# Patient Record
Sex: Female | Born: 2005 | Race: White | Hispanic: No | Marital: Married | Smoking: Never smoker
Health system: Southern US, Community
[De-identification: ages and names within clinical notes are randomized; demographics above are authoritative.]

## PROBLEM LIST (undated history)

## (undated) ENCOUNTER — Inpatient Hospital Stay (HOSPITAL_COMMUNITY): Payer: Self-pay

## (undated) DIAGNOSIS — R519 Headache, unspecified: Secondary | ICD-10-CM

## (undated) DIAGNOSIS — J45909 Unspecified asthma, uncomplicated: Secondary | ICD-10-CM

## (undated) DIAGNOSIS — I10 Essential (primary) hypertension: Secondary | ICD-10-CM

## (undated) HISTORY — PX: OTHER SURGICAL HISTORY: SHX169

## (undated) HISTORY — DX: Headache, unspecified: R51.9

## (undated) HISTORY — PX: ANTERIOR CRUCIATE LIGAMENT REPAIR: SHX115

## (undated) HISTORY — DX: Essential (primary) hypertension: I10

## (undated) HISTORY — PX: CHOLECYSTECTOMY: SHX55

---
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
## 2023-03-29 ENCOUNTER — Other Ambulatory Visit: Payer: Self-pay | Admitting: Obstetrics & Gynecology

## 2023-03-29 DIAGNOSIS — O3680X0 Pregnancy with inconclusive fetal viability, not applicable or unspecified: Secondary | ICD-10-CM

## 2023-03-30 ENCOUNTER — Ambulatory Visit (INDEPENDENT_AMBULATORY_CARE_PROVIDER_SITE_OTHER): Payer: Medicaid Other | Admitting: Radiology

## 2023-03-30 DIAGNOSIS — O3680X0 Pregnancy with inconclusive fetal viability, not applicable or unspecified: Secondary | ICD-10-CM | POA: Diagnosis not present

## 2023-03-30 DIAGNOSIS — Z3A01 Less than 8 weeks gestation of pregnancy: Secondary | ICD-10-CM

## 2023-03-30 NOTE — Progress Notes (Signed)
GA by LMP = 7+5 Anteverted Uterus with single viable gestation CRL = 6.01 mm = 6+3 weeks      9 days < dates      single normal yolk sac = 3.52 mm Normal ovaries - neg adnexal regions  -  neg CDS - no free fluid Cervix long and closed  EDD by today's u/s = 11-20-23

## 2023-05-02 ENCOUNTER — Encounter: Payer: Self-pay | Admitting: Women's Health

## 2023-05-03 ENCOUNTER — Encounter (HOSPITAL_COMMUNITY): Payer: Self-pay | Admitting: Obstetrics & Gynecology

## 2023-05-03 ENCOUNTER — Telehealth: Payer: Self-pay

## 2023-05-03 ENCOUNTER — Inpatient Hospital Stay (HOSPITAL_COMMUNITY)
Admission: AD | Admit: 2023-05-03 | Discharge: 2023-05-03 | Disposition: A | Discharge status: Home / Self Care | Payer: BC Managed Care – PPO | Attending: Obstetrics & Gynecology | Admitting: Obstetrics & Gynecology

## 2023-05-03 ENCOUNTER — Inpatient Hospital Stay (HOSPITAL_COMMUNITY): Payer: BC Managed Care – PPO

## 2023-05-03 ENCOUNTER — Other Ambulatory Visit: Payer: Self-pay

## 2023-05-03 DIAGNOSIS — O209 Hemorrhage in early pregnancy, unspecified: Secondary | ICD-10-CM | POA: Diagnosis present

## 2023-05-03 DIAGNOSIS — R102 Pelvic and perineal pain: Secondary | ICD-10-CM | POA: Diagnosis present

## 2023-05-03 DIAGNOSIS — Z23 Encounter for immunization: Secondary | ICD-10-CM | POA: Insufficient documentation

## 2023-05-03 DIAGNOSIS — N939 Abnormal uterine and vaginal bleeding, unspecified: Secondary | ICD-10-CM

## 2023-05-03 DIAGNOSIS — Z3A12 12 weeks gestation of pregnancy: Secondary | ICD-10-CM | POA: Insufficient documentation

## 2023-05-03 DIAGNOSIS — R109 Unspecified abdominal pain: Secondary | ICD-10-CM

## 2023-05-03 DIAGNOSIS — O26891 Other specified pregnancy related conditions, first trimester: Secondary | ICD-10-CM

## 2023-05-03 DIAGNOSIS — O26899 Other specified pregnancy related conditions, unspecified trimester: Secondary | ICD-10-CM

## 2023-05-03 DIAGNOSIS — Z6791 Unspecified blood type, Rh negative: Secondary | ICD-10-CM | POA: Diagnosis not present

## 2023-05-03 LAB — URINALYSIS, ROUTINE W REFLEX MICROSCOPIC
Bacteria, UA: NONE SEEN
Bilirubin Urine: NEGATIVE
Glucose, UA: NEGATIVE mg/dL
Ketones, ur: NEGATIVE mg/dL
Leukocytes,Ua: NEGATIVE
Nitrite: NEGATIVE
Protein, ur: NEGATIVE mg/dL
Specific Gravity, Urine: 1.01 (ref 1.005–1.030)
pH: 6 (ref 5.0–8.0)

## 2023-05-03 LAB — WET PREP, GENITAL
Clue Cells Wet Prep HPF POC: NONE SEEN
Sperm: NONE SEEN
Trich, Wet Prep: NONE SEEN
WBC, Wet Prep HPF POC: <10 (ref <10)
Yeast Wet Prep HPF POC: NONE SEEN

## 2023-05-03 LAB — CBC WITH DIFFERENTIAL/PLATELET
Abs Immature Granulocytes: 0.02 10*3/uL (ref 0.00–0.07)
Basophils Absolute: 0 10*3/uL (ref 0.0–0.1)
Basophils Relative: 1 %
Eosinophils Absolute: 0.1 10*3/uL (ref 0.0–0.5)
Eosinophils Relative: 2 %
HCT: 38.2 % (ref 36.0–46.0)
Hemoglobin: 12.7 g/dL (ref 12.0–15.0)
Immature Granulocytes: 0 %
Lymphocytes Relative: 28 %
Lymphs Abs: 1.9 10*3/uL (ref 0.7–4.0)
MCH: 29.1 pg (ref 26.0–34.0)
MCHC: 33.2 g/dL (ref 30.0–36.0)
MCV: 87.4 fL (ref 80.0–100.0)
Monocytes Absolute: 0.4 10*3/uL (ref 0.1–1.0)
Monocytes Relative: 6 %
Neutro Abs: 4.2 10*3/uL (ref 1.7–7.7)
Neutrophils Relative %: 63 %
Platelets: 218 10*3/uL (ref 150–400)
RBC: 4.37 MIL/uL (ref 3.87–5.11)
RDW: 13.7 % (ref 11.5–15.5)
WBC: 6.6 10*3/uL (ref 4.0–10.5)
nRBC: 0 % (ref 0.0–0.2)

## 2023-05-03 LAB — HCG, QUANTITATIVE, PREGNANCY: hCG, Beta Chain, Quant, S: 59661 m[IU]/mL — ABNORMAL HIGH (ref <5)

## 2023-05-03 LAB — POCT PREGNANCY, URINE: Preg Test, Ur: POSITIVE — AB

## 2023-05-03 MED ORDER — RHO D IMMUNE GLOBULIN 1500 UNIT/2ML IJ SOSY
300.0000 ug | PREFILLED_SYRINGE | Freq: Once | INTRAMUSCULAR | Status: AC
  Administered 2023-05-03: 300 ug via INTRAMUSCULAR
  Filled 2023-05-03: qty 2

## 2023-05-03 NOTE — MAU Provider Note (Addendum)
History  Kerry Johns , a  17 y.o. G2P0 at [redacted]w[redacted]d presents to MAU for concerns of VB and clots. Patient reports she experienced a large amount of bleeding which lasted for approx 1.5 hours last night around 2330 and at the same time passed a large clot, that looked like "ground up hamburger meat." She reports no further bleeding or passing of clots since. She also reports severe cramping during this time across her lower abdomen, that has since resolved and now reports the pain as 2/10. Patient recently had a baby in July of this year, at 39w and was induced for cHTN, not on meds. Patient reports receiving Rhogam postpartum, and is currently BF and bottle feeding. She denies fever, chills, N/V/D.      RN note: NAEEMA WYCHE is a 17 y.o. at Unknown here in MAU reporting: she began having heavy VB yesterday, but isn't having any today.  Reports she is having lower abdominal cramping today.  Reports has had one PNV, scheduled to return 05/09/2023. LMP: 02/04/2023 Onset of complaint: yesterday Pain score: 6  CSN: [id number]  Arrival date and time: 05/03/23 1106   None     Chief Complaint  Patient presents with   Vaginal Bleeding   Abdominal Pain   HPI  OB History     Gravida  2   Para      Term      Preterm      AB      Living         SAB      IAB      Ectopic      Multiple      Live Births              Past Medical History:  Diagnosis Date   Asthma    as a child    Past Surgical History:  Procedure Laterality Date   ANTERIOR CRUCIATE LIGAMENT REPAIR     arm surgery      Family History  Problem Relation Age of Onset   Hypertension Mother    Hypertension Father    Cancer Maternal Grandmother    Breast cancer Maternal Grandmother    Cancer Maternal Grandfather    Hypertension Paternal Grandmother    Lung cancer Paternal Grandfather     Social History   Tobacco Use   Smoking status: Never   Smokeless tobacco: Never  Substance Use  Topics   Alcohol use: No   Drug use: No    Allergies: No Known Allergies  No medications prior to admission.    Review of Systems  Constitutional:  Negative for chills and fever.  Eyes:  Negative for visual disturbance.  Cardiovascular:  Negative for chest pain and palpitations.  Gastrointestinal:  Negative for diarrhea, nausea and vomiting.  Genitourinary:  Negative for dysuria and vaginal discharge.  Neurological:  Negative for dizziness, syncope, light-headedness and headaches.   Physical Exam   Blood pressure 127/73, pulse 86, temperature 98.5 F (36.9 C), temperature source Oral, resp. rate 20, height 5' 7.5" (1.715 m), weight (!) 138.2 kg, last menstrual period 02/04/2023, SpO2 98%, unknown if currently breastfeeding.  Physical Exam Constitutional:      General: She is not in acute distress.    Appearance: She is not ill-appearing.  Pulmonary:     Effort: Pulmonary effort is normal.  Skin:    General: Skin is warm and dry.  Neurological:     Mental Status: She is oriented to  person, place, and time.  Psychiatric:        Behavior: Behavior normal.     MAU Course  Procedures Orders Placed This Encounter  Procedures   Wet prep, genital   US OB Comp Less 14 Wks   Urinalysis, Routine w reflex microscopic -Urine, Clean Catch   CBC with Differential/Platelet   hCG, quantitative, pregnancy   Pregnancy, urine POC   Rh IG workup (includes ABO/Rh)   Discharge patient    Results for orders placed or performed during the hospital encounter of 05/03/23 (from the past 24 hours)  Pregnancy, urine POC     Status: Abnormal   Collection Time: 05/03/23 11:36 AM  Result Value Ref Range   Preg Test, Ur POSITIVE (A) NEGATIVE  Urinalysis, Routine w reflex microscopic -Urine, Clean Catch     Status: Abnormal   Collection Time: 05/03/23 11:38 AM  Result Value Ref Range   Color, Urine YELLOW YELLOW   APPearance CLEAR CLEAR   Specific Gravity, Urine 1.010 1.005 - 1.030   pH  6.0 5.0 - 8.0   Glucose, UA NEGATIVE NEGATIVE mg/dL   Hgb urine dipstick MODERATE (A) NEGATIVE   Bilirubin Urine NEGATIVE NEGATIVE   Ketones, ur NEGATIVE NEGATIVE mg/dL   Protein, ur NEGATIVE NEGATIVE mg/dL   Nitrite NEGATIVE NEGATIVE   Leukocytes,Ua NEGATIVE NEGATIVE   RBC / HPF 0-5 0 - 5 RBC/hpf   WBC, UA 0-5 0 - 5 WBC/hpf   Bacteria, UA NONE SEEN NONE SEEN   Squamous Epithelial / HPF 0-5 0 - 5 /HPF   Mucus PRESENT   CBC with Differential/Platelet     Status: None   Collection Time: 05/03/23  1:15 PM  Result Value Ref Range   WBC 6.6 4.0 - 10.5 K/uL   RBC 4.37 3.87 - 5.11 MIL/uL   Hemoglobin 12.7 12.0 - 15.0 g/dL   HCT 16.1 09.6 - 04.5 %   MCV 87.4 80.0 - 100.0 fL   MCH 29.1 26.0 - 34.0 pg   MCHC 33.2 30.0 - 36.0 g/dL   RDW 40.9 81.1 - 91.4 %   Platelets 218 150 - 400 K/uL   nRBC 0.0 0.0 - 0.2 %   Neutrophils Relative % 63 %   Neutro Abs 4.2 1.7 - 7.7 K/uL   Lymphocytes Relative 28 %   Lymphs Abs 1.9 0.7 - 4.0 K/uL   Monocytes Relative 6 %   Monocytes Absolute 0.4 0.1 - 1.0 K/uL   Eosinophils Relative 2 %   Eosinophils Absolute 0.1 0.0 - 0.5 K/uL   Basophils Relative 1 %   Basophils Absolute 0.0 0.0 - 0.1 K/uL   Immature Granulocytes 0 %   Abs Immature Granulocytes 0.02 0.00 - 0.07 K/uL  Rh IG workup (includes ABO/Rh)     Status: None (Preliminary result)   Collection Time: 05/03/23  1:15 PM  Result Value Ref Range   Gestational Age(Wks) 12    ABO/RH(D) O NEG    Antibody Screen NEG    Unit Number [id number]    Blood Component Type RHIG    Unit division 00    Status of Unit ISSUED    Transfusion Status      OK TO TRANSFUSE Performed at [facility], [address], [place], [place] [address]   Wet prep, genital     Status: None   Collection Time: 05/03/23  1:29 PM   Specimen: PATH Cytology Cervicovaginal Ancillary Only  Result Value Ref Range   Yeast Wet Prep  HPF POC NONE SEEN NONE SEEN   Trich, Wet Prep NONE SEEN NONE SEEN   Clue Cells Wet Prep HPF  POC NONE SEEN NONE SEEN   WBC, Wet Prep HPF POC <10 <10   Sperm NONE SEEN   hCG, quantitative, pregnancy     Status: Abnormal   Collection Time: 05/03/23  1:50 PM  Result Value Ref Range   hCG, Beta Chain, Quant, S 59,661 (H) <5 mIU/mL    Results for orders placed or performed during the hospital encounter of 05/03/23 (from the past 24 hours)  Pregnancy, urine POC     Status: Abnormal   Collection Time: 05/03/23 11:36 AM  Result Value Ref Range   Preg Test, Ur POSITIVE (A) NEGATIVE  Urinalysis, Routine w reflex microscopic -Urine, Clean Catch     Status: Abnormal   Collection Time: 05/03/23 11:38 AM  Result Value Ref Range   Color, Urine YELLOW YELLOW   APPearance CLEAR CLEAR   Specific Gravity, Urine 1.010 1.005 - 1.030   pH 6.0 5.0 - 8.0   Glucose, UA NEGATIVE NEGATIVE mg/dL   Hgb urine dipstick MODERATE (A) NEGATIVE   Bilirubin Urine NEGATIVE NEGATIVE   Ketones, ur NEGATIVE NEGATIVE mg/dL   Protein, ur NEGATIVE NEGATIVE mg/dL   Nitrite NEGATIVE NEGATIVE   Leukocytes,Ua NEGATIVE NEGATIVE   RBC / HPF 0-5 0 - 5 RBC/hpf   WBC, UA 0-5 0 - 5 WBC/hpf   Bacteria, UA NONE SEEN NONE SEEN   Squamous Epithelial / HPF 0-5 0 - 5 /HPF   Mucus PRESENT   CBC with Differential/Platelet     Status: None   Collection Time: 05/03/23  1:15 PM  Result Value Ref Range   WBC 6.6 4.0 - 10.5 K/uL   RBC 4.37 3.87 - 5.11 MIL/uL   Hemoglobin 12.7 12.0 - 15.0 g/dL   HCT 19.1 47.8 - 29.5 %   MCV 87.4 80.0 - 100.0 fL   MCH 29.1 26.0 - 34.0 pg   MCHC 33.2 30.0 - 36.0 g/dL   RDW 62.1 30.8 - 65.7 %   Platelets 218 150 - 400 K/uL   nRBC 0.0 0.0 - 0.2 %   Neutrophils Relative % 63 %   Neutro Abs 4.2 1.7 - 7.7 K/uL   Lymphocytes Relative 28 %   Lymphs Abs 1.9 0.7 - 4.0 K/uL   Monocytes Relative 6 %   Monocytes Absolute 0.4 0.1 - 1.0 K/uL   Eosinophils Relative 2 %   Eosinophils Absolute 0.1 0.0 - 0.5 K/uL   Basophils Relative 1 %   Basophils Absolute 0.0 0.0 - 0.1 K/uL   Immature Granulocytes 0 %    Abs Immature Granulocytes 0.02 0.00 - 0.07 K/uL  Rh IG workup (includes ABO/Rh)     Status: None (Preliminary result)   Collection Time: 05/03/23  1:15 PM  Result Value Ref Range   Gestational Age(Wks) 12    ABO/RH(D) O NEG    Antibody Screen NEG    Unit Number [id number]    Blood Component Type RHIG    Unit division 00    Status of Unit ISSUED    Transfusion Status      OK TO TRANSFUSE Performed at [facility], [address], [place], [place] [address]   Wet prep, genital     Status: None   Collection Time: 05/03/23  1:29 PM   Specimen: PATH Cytology Cervicovaginal Ancillary Only  Result Value Ref Range   Yeast Wet Prep HPF POC  NONE SEEN NONE SEEN   Trich, Wet Prep NONE SEEN NONE SEEN   Clue Cells Wet Prep HPF POC NONE SEEN NONE SEEN   WBC, Wet Prep HPF POC <10 <10   Sperm NONE SEEN   hCG, quantitative, pregnancy     Status: Abnormal   Collection Time: 05/03/23  1:50 PM  Result Value Ref Range   hCG, Beta Chain, Quant, S 59,661 (H) <5 mIU/mL    MDM Missed/Spontaneous Abortion Infection, unspecified Vaginal bleeding, in pregnancy, unspecified Urinalysis: Negative. Afebrile. Low suspicion for bladder/kidney infection Wetprep: Negative GC/CT: pending ABO/Rh/RhIgG/CBCD: Vaginal bleeding in 2nd trimester. Rh negative. ABS negative.  Rhogam given hCG quant: 59,661 Ultrasound: Single living IUP measuring about 12 weeks.  - plan for discharge.   Assessment and Plan   1. Vaginal bleeding   2. Abdominal cramping   3. [redacted] weeks gestation of pregnancy   4. Rh negative state in antepartum period    - Recommended pelvic rest until followed up with OBGYN. - Reviewed worsening signs and return precautions.  - Rhogam given to patient prior to discharge for vaginal bleeding.  - Patient discharged home in stable condition and may return to MAU as needed.   Claudette Head, MSN CNM  05/03/2023, 10:13 PM

## 2023-05-03 NOTE — Telephone Encounter (Signed)
PATIENT CALLED AND STATED THAT BLEEDING STOPPED BUT WANTS A NURSE TO CALL HER

## 2023-05-03 NOTE — Telephone Encounter (Signed)
Patient states she started having bleeding last night that ran down her leg onto her carpet.  States she started having cramping as well and passed what looked like ground beef.  She continued to have to light bleeding and cramping until this morning.  Advised patient that she would need an ultrasound and blood work and possibly rhogam due to Rh negative blood.  Advised to go to MAU for evaluation. Verbalized understanding and agreeable to go.

## 2023-05-03 NOTE — MAU Note (Signed)
Kerry Johns is a 17 y.o. at Unknown here in MAU reporting: she began having heavy VB yesterday, but isn't having any today.  Reports she is having lower abdominal cramping today.  Reports has had one PNV, scheduled to return 05/09/2023. LMP: 02/04/2023 Onset of complaint: yesterday Pain score: 6 Vitals:   05/03/23 1124  BP: (!) 140/90  Pulse: (!) 104  Resp: 18  Temp: 98.4 F (36.9 C)  SpO2: 99%     FHT:NA Lab orders placed from triage:   UPT

## 2023-05-04 LAB — GC/CHLAMYDIA PROBE AMP (~~LOC~~) NOT AT ARMC
Chlamydia: NEGATIVE
Comment: NEGATIVE
Comment: NORMAL
Neisseria Gonorrhea: NEGATIVE

## 2023-05-04 LAB — RH IG WORKUP (INCLUDES ABO/RH)
ABO/RH(D): O NEG
Antibody Screen: NEGATIVE
Gestational Age(Wks): 12
Unit division: 0

## 2023-05-06 ENCOUNTER — Encounter: Payer: Self-pay | Admitting: Women's Health

## 2023-05-06 DIAGNOSIS — O0990 Supervision of high risk pregnancy, unspecified, unspecified trimester: Secondary | ICD-10-CM | POA: Insufficient documentation

## 2023-05-09 ENCOUNTER — Encounter: Payer: BC Managed Care – PPO | Admitting: *Deleted

## 2023-05-09 ENCOUNTER — Encounter: Payer: Self-pay | Admitting: Advanced Practice Midwife

## 2023-05-09 ENCOUNTER — Ambulatory Visit: Payer: BC Managed Care – PPO | Admitting: Women's Health

## 2023-05-09 VITALS — BP 141/84 | HR 89 | Wt 305.0 lb

## 2023-05-09 DIAGNOSIS — Z6841 Body Mass Index (BMI) 40.0 and over, adult: Secondary | ICD-10-CM

## 2023-05-09 DIAGNOSIS — O10911 Unspecified pre-existing hypertension complicating pregnancy, first trimester: Secondary | ICD-10-CM | POA: Diagnosis not present

## 2023-05-09 DIAGNOSIS — O10919 Unspecified pre-existing hypertension complicating pregnancy, unspecified trimester: Secondary | ICD-10-CM

## 2023-05-09 DIAGNOSIS — Z131 Encounter for screening for diabetes mellitus: Secondary | ICD-10-CM | POA: Diagnosis not present

## 2023-05-09 DIAGNOSIS — O09891 Supervision of other high risk pregnancies, first trimester: Secondary | ICD-10-CM | POA: Insufficient documentation

## 2023-05-09 DIAGNOSIS — O0991 Supervision of high risk pregnancy, unspecified, first trimester: Secondary | ICD-10-CM | POA: Diagnosis not present

## 2023-05-09 DIAGNOSIS — Z363 Encounter for antenatal screening for malformations: Secondary | ICD-10-CM

## 2023-05-09 DIAGNOSIS — Z6791 Unspecified blood type, Rh negative: Secondary | ICD-10-CM

## 2023-05-09 DIAGNOSIS — O26891 Other specified pregnancy related conditions, first trimester: Secondary | ICD-10-CM

## 2023-05-09 DIAGNOSIS — O99212 Obesity complicating pregnancy, second trimester: Secondary | ICD-10-CM

## 2023-05-09 DIAGNOSIS — O99211 Obesity complicating pregnancy, first trimester: Secondary | ICD-10-CM

## 2023-05-09 DIAGNOSIS — Z3A13 13 weeks gestation of pregnancy: Secondary | ICD-10-CM | POA: Diagnosis not present

## 2023-05-09 MED ORDER — LABETALOL HCL 200 MG PO TABS: 200.0000 mg | ORAL_TABLET | Freq: Two times a day (BID) | ORAL | 3 refills | Status: DC

## 2023-05-09 MED ORDER — ASPIRIN 81 MG PO TBEC: 162.0000 mg | DELAYED_RELEASE_TABLET | Freq: Every day | ORAL | 6 refills | Status: DC

## 2023-05-09 NOTE — Progress Notes (Signed)
INITIAL OBSTETRICAL VISIT Patient name: Kerry Johns MRN [id number]  Date of birth: Aug 10, 2005 Chief Complaint:   Initial Prenatal Visit  History of Present Illness:   Kerry Johns is a 17 y.o. G30P1001 Caucasian female at [redacted]w[redacted]d by [place] at 6 weeks with an Estimated Date of Delivery: 11/20/23 being seen today for her initial obstetrical visit.   Her obstetrical history is significant for SVD July 2024 w/o problems CHTN Morbid Obestiy .   Today she reports no complaints.     05/09/2023   11:17 AM 05/19/2022   10:35 AM  Depression screen PHQ 2/9  Decreased Interest 1 1  Down, Depressed, Hopeless 1 0  PHQ - 2 Score 2 1  Altered sleeping 1 1  Tired, decreased energy 1 1  Change in appetite 0 1  Feeling bad or failure about yourself  1 0  Trouble concentrating 1 0  Moving slowly or fidgety/restless 0 0  Suicidal thoughts 0 0  PHQ-9 Score 6 4    Patient's last menstrual period was 02/04/2023. Last pap No results found for: "DIAGPAP" Review of Systems:   Pertinent items are noted in HPI Denies cramping/contractions, leakage of fluid, vaginal bleeding, abnormal vaginal discharge w/ itching/odor/irritation, headaches, visual changes, shortness of breath, chest pain, abdominal pain, severe nausea/vomiting, or problems with urination or bowel movements unless otherwise stated above.  Pertinent History Reviewed:  Reviewed past medical,surgical, social, obstetrical and family history.  Reviewed problem list, medications and allergies. OB History  Gravida Para Term Preterm AB Living  2 1 1   1   SAB IAB Ectopic Multiple Live Births      1    # Outcome Date GA Lbr Len/2nd Weight Sex Type Anes PTL Lv  2 Current           1 Term 11/16/22 [redacted]w[redacted]d  7 lb 3 oz (3.26 kg) F Vag-Spont EPI N LIV     Complications: Chronic hypertension   Physical Assessment:   Vitals:   05/09/23 1121  BP: (!) 141/84  Pulse: 89  Weight: (!) 305 lb (138.3 kg)  Body mass index is 47.06 kg/m.        Physical Examination:  General appearance - well appearing, and in no distress  Mental status - alert, oriented to person, place, and time  Psych:  She has a normal mood and affect  Skin - warm and dry, normal color, no suspicious lesions noted  Chest - effort normal  Heart - normal rate and regular rhythm  Abdomen - soft, nontender  Extremities:  No swelling or varicosities noted    No results found for this or any previous visit (from the past 24 hours).      05/09/2023   11:17 AM 05/19/2022   10:35 AM  Depression screen PHQ 2/9  Decreased Interest 1 1  Down, Depressed, Hopeless 1 0  PHQ - 2 Score 2 1  Altered sleeping 1 1  Tired, decreased energy 1 1  Change in appetite 0 1  Feeling bad or failure about yourself  1 0  Trouble concentrating 1 0  Moving slowly or fidgety/restless 0 0  Suicidal thoughts 0 0  PHQ-9 Score 6 4        05/09/2023   11:17 AM 05/19/2022   10:35 AM  GAD 7 : Generalized Anxiety Score  Nervous, Anxious, on Edge 1 1  Control/stop worrying 1 1  Worry too much - different things 1 1  Trouble relaxing 0 0  Restless  0 1  Easily annoyed or irritable 0 1  Afraid - awful might happen 2 1  Total GAD 7 Score 5 6      Assessment & Plan:  1)High-Risk Pregnancy G2P1001 at [redacted]w[redacted]d with an Estimated Date of Delivery: 11/20/23   2) Initial OB visit    1. Supervision of high risk pregnancy in first trimester (Primary)  - Urine Culture - Protein / creatinine ratio, urine - PANORAMA PRENATAL TEST - Hemoglobin A1c - CHL AMB BABYSCRIPTS SCHEDULE OPTIMIZATION - CBC/D/Plt+RPR+Rh+ABO+RubIgG... - Comprehensive metabolic panel - GC/Chlamydia Probe Amp - HORIZON CUSTOM  2. [redacted] weeks gestation of pregnancy   3. Diabetes mellitus screening  - Hemoglobin A1c  4. Chronic hypertension during pregnancy, antepartum  - Protein / creatinine ratio, urine - Comprehensive metabolic panel- - start labetalol 200mg  BID, ASA 162mg   5. Body mass index (BMI)  45.0-49.9, adult (HCC)  - Hemoglobin A1c       Meds:  Meds ordered this encounter  Medications   labetalol (NORMODYNE) 200 MG tablet    Sig: Take 1 tablet (200 mg total) by mouth 2 (two) times daily.    Dispense:  60 tablet    Refill:  3    Supervising Provider:   Lazaro Arms [[id number]]   aspirin EC 81 MG tablet    Sig: Take 2 tablets (162 mg total) by mouth daily.    Dispense:  60 tablet    Refill:  6    Supervising Provider:   Duane Lope H [[id number]]    Initial labs obtained Continue prenatal vitamins Reviewed n/v relief measures and warning s/s to report Reviewed recommended weight gain based on pre-gravid BMI Encouraged well-balanced diet Genetic & carrier screening discussed: requests Panorama, AFP, and Horizon , declines NT/IT Ultrasound discussed; fetal survey: requested CCNC completed> form faxed if has or is planning to apply for medicaid The nature of [place] [facility] [employer] with multiple MDs and other Advanced Practice Providers was explained to patient; also emphasized that fellows, residents, and students are part of our team. Given a  home bp cuff.. Check bp weekly, let us know if >140/90.        Kerry Johns 12:15 PM

## 2023-05-09 NOTE — Patient Instructions (Signed)

## 2023-05-11 LAB — GC/CHLAMYDIA PROBE AMP
Chlamydia trachomatis, NAA: NEGATIVE
Neisseria Gonorrhoeae by PCR: NEGATIVE

## 2023-05-11 LAB — URINE CULTURE

## 2023-05-15 LAB — PANORAMA PRENATAL TEST FULL PANEL:PANORAMA TEST PLUS 5 ADDITIONAL MICRODELETIONS: FETAL FRACTION: 2.5

## 2023-05-16 ENCOUNTER — Encounter: Payer: Self-pay | Admitting: *Deleted

## 2023-05-16 ENCOUNTER — Telehealth: Payer: BC Managed Care – PPO | Admitting: *Deleted

## 2023-05-16 VITALS — BP 128/77 | HR 88

## 2023-05-16 DIAGNOSIS — Z3A13 13 weeks gestation of pregnancy: Secondary | ICD-10-CM

## 2023-05-16 DIAGNOSIS — O0991 Supervision of high risk pregnancy, unspecified, first trimester: Secondary | ICD-10-CM

## 2023-05-16 DIAGNOSIS — Z0130 Encounter for examination of blood pressure without abnormal findings: Secondary | ICD-10-CM

## 2023-05-17 LAB — CBC/D/PLT+RPR+RH+ABO+RUBIGG...
Basophils Absolute: 0 10*3/uL (ref 0.0–0.2)
Basos: 1 %
EOS (ABSOLUTE): 0.1 10*3/uL (ref 0.0–0.4)
Eos: 2 %
HCV Ab: NONREACTIVE
HIV Screen 4th Generation wRfx: NONREACTIVE
Hematocrit: 40.1 % (ref 34.0–46.6)
Hemoglobin: 13 g/dL (ref 11.1–15.9)
Hepatitis B Surface Ag: NEGATIVE
Immature Grans (Abs): 0 10*3/uL (ref 0.0–0.1)
Immature Granulocytes: 0 %
Lymphocytes Absolute: 1.9 10*3/uL (ref 0.7–3.1)
Lymphs: 30 %
MCH: 29.1 pg (ref 26.6–33.0)
MCHC: 32.4 g/dL (ref 31.5–35.7)
MCV: 90 fL (ref 79–97)
Monocytes Absolute: 0.4 10*3/uL (ref 0.1–0.9)
Monocytes: 6 %
Neutrophils Absolute: 3.9 10*3/uL (ref 1.4–7.0)
Neutrophils: 61 %
Platelets: 215 10*3/uL (ref 150–450)
RBC: 4.46 x10E6/uL (ref 3.77–5.28)
RDW: 13.9 % (ref 11.7–15.4)
RPR Ser Ql: NONREACTIVE
Rh Factor: NEGATIVE
Rubella Antibodies, IGG: 1.36 {index} (ref >0.99)
WBC: 6.3 10*3/uL (ref 3.4–10.8)

## 2023-05-17 LAB — COMPREHENSIVE METABOLIC PANEL
ALT: 42 [IU]/L — ABNORMAL HIGH (ref 0–32)
AST: 30 [IU]/L (ref 0–40)
Albumin: 4.1 g/dL (ref 4.0–5.0)
Alkaline Phosphatase: 97 [IU]/L (ref 42–106)
BUN/Creatinine Ratio: 8 — ABNORMAL LOW (ref 9–23)
BUN: 5 mg/dL — ABNORMAL LOW (ref 6–20)
Bilirubin Total: 0.5 mg/dL (ref 0.0–1.2)
CO2: 21 mmol/L (ref 20–29)
Calcium: 9.5 mg/dL (ref 8.7–10.2)
Chloride: 103 mmol/L (ref 96–106)
Creatinine, Ser: 0.59 mg/dL (ref 0.57–1.00)
Globulin, Total: 2.3 g/dL (ref 1.5–4.5)
Glucose: 73 mg/dL (ref 70–99)
Potassium: 4.2 mmol/L (ref 3.5–5.2)
Sodium: 138 mmol/L (ref 134–144)
Total Protein: 6.4 g/dL (ref 6.0–8.5)
eGFR: 133 mL/min/{1.73_m2} (ref >59)

## 2023-05-17 LAB — AB SCR+ANTIBODY ID: Antibody Screen: POSITIVE — AB

## 2023-05-17 LAB — HCV INTERPRETATION

## 2023-05-17 LAB — PROTEIN / CREATININE RATIO, URINE
Creatinine, Urine: 105.5 mg/dL
Protein, Ur: 9.4 mg/dL
Protein/Creat Ratio: 89 mg/g{creat} (ref 0–200)

## 2023-05-17 LAB — HEMOGLOBIN A1C
Est. average glucose Bld gHb Est-mCnc: 97 mg/dL
Hgb A1c MFr Bld: 5 % (ref 4.8–5.6)

## 2023-05-18 NOTE — L&D Delivery Note (Signed)
 OB/GYN Faculty Practice Delivery Note  Kerry Johns is a 18 y.o. G2P1001 s/p SVD at [redacted]w[redacted]d. She was admitted for induction of labor for cHTN.   ROM: 8h 54m with clear fluid GBS Status: Negative/-- (06/10 1133) Maximum Maternal Temperature: 82F   Labor Progress: Initial SVE: 3/50/-2. She then progressed to complete.   Delivery Date/Time: 10/30/23 1748 Delivery: At bedside due to recurrent deep variable decels, baby would recover to baseline of 130s, but decels down into the 60s. NICU called to bedside due to persistent deep variable decels with each contraction. Head delivered LOA. A loose nuchal was reduced at the perineum. The anterior shoulder was not delivering with typical traction required for delivery of anterior shoulder. No excessive traction was used to facilitate delivery of infant. A shoulder dystocia was identified -- about to call to alert OR team and call for additional nursing team, however was able to deliver infant successfully prior to. The head of the bed was laid flat. Nursing team applied suprapubic pressure. Still unable to deliver infant so posterior axillary sling attempted with successful delivery of posterior arm with subsequent delivery of anterior shoulder and remainder of body with ease. Episiotomy NOT required for delivery. Remainder of body delivered with single maternal effort. Infant dried and stimulated and was vigorous with spontaneous cry. Cord gases not collected given vigorous infant.  APGARS 8/9. Cord clamped x 2 after 1-minute delay, and cut by father of baby. Cord blood drawn. Placenta delivered spontaneously with gentle cord traction. Sticky membranes noted at posterior vagina while evaluating for lacerations, so a lower uterine sweep was performed with successful removal of membranes. Fundus firm with massage and Pitocin. Labia, perineum, vagina, and cervix inspected inspected with a hemostatic right periurethral laceration noted, which did not require  repair.  Baby Weight: pending  Placenta: Sent to L&D Complications: Shoulder dystocia <1 min Lacerations: Hemostatic right periurethral EBL: 58 mL Analgesia: Epidural   Infant:  APGAR (1 MIN): 8  APGAR (5 MINS): 9   Melanie Spires, MD OB Fellow, Faculty Practice [facility], [facility]

## 2023-05-20 LAB — HORIZON CUSTOM: REPORT SUMMARY: NEGATIVE

## 2023-06-06 ENCOUNTER — Ambulatory Visit (INDEPENDENT_AMBULATORY_CARE_PROVIDER_SITE_OTHER): Payer: BC Managed Care – PPO | Admitting: Advanced Practice Midwife

## 2023-06-06 ENCOUNTER — Encounter: Payer: Self-pay | Admitting: Advanced Practice Midwife

## 2023-06-06 VITALS — BP 133/81 | HR 113 | Wt 301.5 lb

## 2023-06-06 DIAGNOSIS — O99891 Other specified diseases and conditions complicating pregnancy: Secondary | ICD-10-CM

## 2023-06-06 DIAGNOSIS — O0992 Supervision of high risk pregnancy, unspecified, second trimester: Secondary | ICD-10-CM

## 2023-06-06 DIAGNOSIS — M549 Dorsalgia, unspecified: Secondary | ICD-10-CM

## 2023-06-06 DIAGNOSIS — Z3A16 16 weeks gestation of pregnancy: Secondary | ICD-10-CM

## 2023-06-06 MED ORDER — CYCLOBENZAPRINE HCL 10 MG PO TABS: 10.0000 mg | ORAL_TABLET | Freq: Three times a day (TID) | ORAL | 1 refills | Status: DC | PRN

## 2023-06-06 NOTE — Progress Notes (Signed)
HIGH-RISK PREGNANCY VISIT Patient name: Kerry Johns MRN [id number]  Date of birth: [birth year] Chief Complaint:   High Risk Gestation (Low back pain and abd pain)  History of Present Illness:   Kerry Johns is a 18 y.o. G75P1001 female at [redacted]w[redacted]d with an Estimated Date of Delivery: 11/20/23 being seen today for ongoing management of a high-risk pregnancy complicated by chronic hypertension currently on labetalol 200mg  BID. Did take her BP meds this am.    Today she reports LBP that radiates along the front.  Denies dysuria. Has trouble sleeping/positioning. Contractions: Not present. Vag. Bleeding: None.   . denies leaking of fluid.      05/09/2023   11:17 AM 05/19/2022   10:35 AM  Depression screen PHQ 2/9  Decreased Interest 1 1  Down, Depressed, Hopeless 1 0  PHQ - 2 Score 2 1  Altered sleeping 1 1  Tired, decreased energy 1 1  Change in appetite 0 1  Feeling bad or failure about yourself  1 0  Trouble concentrating 1 0  Moving slowly or fidgety/restless 0 0  Suicidal thoughts 0 0  PHQ-9 Score 6 4        05/09/2023   11:17 AM 05/19/2022   10:35 AM  GAD 7 : Generalized Anxiety Score  Nervous, Anxious, on Edge 1 1  Control/stop worrying 1 1  Worry too much - different things 1 1  Trouble relaxing 0 0  Restless 0 1  Easily annoyed or irritable 0 1  Afraid - awful might happen 2 1  Total GAD 7 Score 5 6     Review of Systems:   Pertinent items are noted in HPI Denies abnormal vaginal discharge w/ itching/odor/irritation, headaches, visual changes, shortness of breath, chest pain, abdominal pain, severe nausea/vomiting, or problems with urination or bowel movements unless otherwise stated above. Pertinent History Reviewed:  Reviewed past medical,surgical, social, obstetrical and family history.  Reviewed problem list, medications and allergies. Physical Assessment:   Vitals:   06/06/23 1021 06/06/23 1041 06/06/23 1043  BP: (!) 143/89 133/81 133/81  Pulse: (!)  113 (!) 113   Weight: (!) 301 lb 8 oz (136.8 kg)    Body mass index is 46.52 kg/m.           Physical Examination:   General appearance: alert, well appearing, and in no distress  Mental status: alert, oriented to person, place, and time  Skin: warm & dry   Extremities: Edema: None    Cardiovascular: normal heart rate noted  Respiratory: normal respiratory effort, no distress  Abdomen: gravid, soft, non-tender  Pelvic: Cervical exam deferred         Chaperone: N/A    Fetal Status: Fetal Heart Rate (bpm): 162        Fetal Surveillance Testing today: doppler.  Declined further attempts at cfDNA (tried X2)     No results found for this or any previous visit (from the past 24 hours).  Assessment & Plan:  High-risk pregnancy: G2P1001 at [redacted]w[redacted]d with an Estimated Date of Delivery: 11/20/23   1. Supervision of high risk pregnancy in second trimester (Primary)-  2.  LBP:  try flexeril, check urine cx    Meds:  Meds ordered this encounter  Medications   cyclobenzaprine (FLEXERIL) 10 MG tablet    Sig: Take 1 tablet (10 mg total) by mouth every 8 (eight) hours as needed for muscle spasms.    Dispense:  30 tablet    Refill:  1  Supervising Provider:   Lazaro Arms [[id number]]    Orders:  Orders Placed This Encounter  Procedures   Urine Culture     Labs/procedures today: urine culture   Reviewed: general obstetric precautions including but not limited to vaginal bleeding, contractions, leaking of fluid and fetal movement were reviewed in detail with the patient.  All questions were answered. Does have home bp cuff. Office bp cuff given: not applicable. Check bp weekly, let us know if consistently >140 and/or >90.  Follow-up: No follow-ups on file.   Future Appointments  Date Time Provider Department Center  06/27/2023 10:00 AM [facility] - [facility] 2 [facility] None  06/27/2023 10:50 AM Cheral Marker, CNM [facility]    Orders Placed This Encounter  Procedures   Urine  Culture   Jacklyn Shell , DNP, CNM [facility] 06/06/2023 10:44 AM

## 2023-06-06 NOTE — Patient Instructions (Signed)
For your lower back pain you may: Purchase a pregnancy/maternity support belt from [employer], [employer], [employer], [employer], etc and wear it while you are up and about Take warm baths Use a heating pad to your lower back for no longer than 20 minutes at a time, and do not place near abdomen Take tylenol as needed. Please follow directions on the bottle Kinesiology tape (can get from sporting goods store), google how to tape belly for pregnancy

## 2023-06-08 LAB — URINE CULTURE

## 2023-06-27 ENCOUNTER — Encounter: Payer: Self-pay | Admitting: Women's Health

## 2023-06-27 ENCOUNTER — Ambulatory Visit: Payer: BC Managed Care – PPO | Admitting: Radiology

## 2023-06-27 ENCOUNTER — Ambulatory Visit (INDEPENDENT_AMBULATORY_CARE_PROVIDER_SITE_OTHER): Payer: Medicaid Other | Admitting: Women's Health

## 2023-06-27 VITALS — BP 123/80 | HR 97 | Wt 297.0 lb

## 2023-06-27 DIAGNOSIS — O0992 Supervision of high risk pregnancy, unspecified, second trimester: Secondary | ICD-10-CM

## 2023-06-27 DIAGNOSIS — O10912 Unspecified pre-existing hypertension complicating pregnancy, second trimester: Secondary | ICD-10-CM

## 2023-06-27 DIAGNOSIS — Z3A19 19 weeks gestation of pregnancy: Secondary | ICD-10-CM

## 2023-06-27 DIAGNOSIS — Z363 Encounter for antenatal screening for malformations: Secondary | ICD-10-CM | POA: Diagnosis not present

## 2023-06-27 DIAGNOSIS — Z362 Encounter for other antenatal screening follow-up: Secondary | ICD-10-CM

## 2023-06-27 DIAGNOSIS — Z6841 Body Mass Index (BMI) 40.0 and over, adult: Secondary | ICD-10-CM

## 2023-06-27 DIAGNOSIS — O10919 Unspecified pre-existing hypertension complicating pregnancy, unspecified trimester: Secondary | ICD-10-CM

## 2023-06-27 NOTE — Progress Notes (Signed)
 HIGH-RISK PREGNANCY VISIT Patient name: Kerry Johns MRN [id number]  Date of birth: 2005-11-01 Chief Complaint:   Routine Prenatal Visit and Pregnancy Ultrasound  History of Present Illness:   Kerry Johns is a 18 y.o. G66P1001 female at [redacted]w[redacted]d with an Estimated Date of Delivery: 11/20/23 being seen today for ongoing management of a high-risk pregnancy complicated by chronic hypertension currently on no meds (see note below) and PG BMI 45.    Today she reports  stopped labetalol  b/c was causing sores on head and felt terrible, checks bp bid and all <140/90 . Contractions: Not present.  .  Movement: Present. denies leaking of fluid.      05/09/2023   11:17 AM 05/19/2022   10:35 AM  Depression screen PHQ 2/9  Decreased Interest 1 1  Down, Depressed, Hopeless 1 0  PHQ - 2 Score 2 1  Altered sleeping 1 1  Tired, decreased energy 1 1  Change in appetite 0 1  Feeling bad or failure about yourself  1 0  Trouble concentrating 1 0  Moving slowly or fidgety/restless 0 0  Suicidal thoughts 0 0  PHQ-9 Score 6 4        05/09/2023   11:17 AM 05/19/2022   10:35 AM  GAD 7 : Generalized Anxiety Score  Nervous, Anxious, on Edge 1 1  Control/stop worrying 1 1  Worry too much - different things 1 1  Trouble relaxing 0 0  Restless 0 1  Easily annoyed or irritable 0 1  Afraid - awful might happen 2 1  Total GAD 7 Score 5 6     Review of Systems:   Pertinent items are noted in HPI Denies abnormal vaginal discharge w/ itching/odor/irritation, headaches, visual changes, shortness of breath, chest pain, abdominal pain, severe nausea/vomiting, or problems with urination or bowel movements unless otherwise stated above. Pertinent History Reviewed:  Reviewed past medical,surgical, social, obstetrical and family history.  Reviewed problem list, medications and allergies. Physical Assessment:   Vitals:   06/27/23 1034  BP: 123/80  Pulse: 97  Weight: 297 lb (134.7 kg)  Body mass index is  45.83 kg/m.           Physical Examination:   General appearance: alert, well appearing, and in no distress  Mental status: alert, oriented to person, place, and time  Skin: warm & dry   Extremities: Edema: None    Cardiovascular: normal heart rate noted  Respiratory: normal respiratory effort, no distress  Abdomen: gravid, soft, non-tender  Pelvic: Cervical exam deferred         Fetal Status:     Movement: Present    Fetal Surveillance Testing today:  US :  GA = 19+1 weeks  Single active female fetus, breech, FHR = 150 bpm, posterior pl, Gr1 MVP = 5.1 cm,  EFW 61%,  292g     Incomplete anatomy screen, need all cardiac views and Profile view/NB's CL = 3.85 cm, closed -  Nl ov's   Chaperone: N/A    No results found for this or any previous visit (from the past 24 hours).  Assessment & Plan:  High-risk pregnancy: G2P1001 at [redacted]w[redacted]d with an Estimated Date of Delivery: 11/20/23   1) CHTN, stopped labetalol  d/t sores on head and feeling terrible, bp stable, home bp's wnl. Continue checking at least daily at home, if >140/90 let us  know. Not taking ASA consistently, discussed reason/importance  2) PG BMI 45  Meds: No orders of the defined types were placed  in this encounter.   Labs/procedures today: U/S and declined flu shot, had originally wanted AFP-declined today  Treatment Plan:  U/S q4wks   2x/wk nst or weekly BPP @ 34wks (32wk if goes back on bp meds)   Deliver @ 39-40wks (or 37-39 if goes back on bp meds)  Reviewed: Preterm labor symptoms and general obstetric precautions including but not limited to vaginal bleeding, contractions, leaking of fluid and fetal movement were reviewed in detail with the patient.  All questions were answered. Does have home bp cuff. Office bp cuff given: not applicable. Check bp daily, let us  know if consistently >140 and/or >90.  Follow-up: Return in about 4 weeks (around 07/25/2023) for HROB, US :EFW, MD or CNM, in person.   Future Appointments   Date Time Provider Department Center  07/20/2023 10:45 AM [facility] - [facility] US  [facility] None  07/20/2023 11:30 AM Jolayne Natter, CNM [facility]    Orders Placed This Encounter  Procedures   US  OB Follow Up   Ferd Householder CNM, [facility] 06/27/2023 11:22 AM

## 2023-06-27 NOTE — Progress Notes (Signed)
 US :  GA = 19+1 weeks  Single active female fetus, breech, FHR = 150 bpm, posterior pl, Gr1 MVP = 5.1 cm,  EFW 61%,  292g     Incomplete anatomy screen, need all cardiac views and Profile view/NB's CL = 3.85 cm, closed -  Nl ov's

## 2023-06-27 NOTE — Patient Instructions (Signed)
Kerry Johns, thank you for choosing our office today! We appreciate the opportunity to meet your healthcare needs. You may receive a short survey by mail, e-mail, or through [employer]. If you are happy with your care we would appreciate if you could take just a few minutes to complete the survey questions. We read all of your comments and take your feedback very seriously. Thank you again for choosing our office.  Center for [employer] Team at [place] at [facility] ([place], [place] [address]) Entrance C, located off of [address] parking  Go to [email or web address] to register for FREE online childbirth classes  Call the office [phone]) or go to [facility] if: You begin to severe cramping Your water breaks.  Sometimes it is a big gush of fluid, sometimes it is just a trickle that keeps getting your panties wet or running down your legs You have vaginal bleeding.  It is normal to have a small amount of spotting if your cervix was checked.   [facility] Pediatricians/Family Doctors [facility]): [address] [place], [facility]: [address] [place], [phone]                [facility]): [place], [phone] (call to ask if accepting patients) [facility] Main Department: [place] [address], [place], [place] Pediatricians/Family Doctors [facility]): [place]. [place], [address], [facility]: [address] [place], [place] [facility]: [place], [place] [facility]): [phone] [facility]: [address], [place]: [address] [place]: [phone], [phone]  Home Blood Pressure Monitoring for Patients   Your provider has recommended that you check your blood pressure (BP) at least once a week at home. If you do not have a blood pressure cuff at home, one will be provided for you. Contact your provider if you have not received your monitor within 1 week.   Helpful Tips for Accurate Home Blood Pressure Checks  Don't smoke, exercise, or drink caffeine 30 minutes before checking your BP Use the restroom before checking your BP (a full bladder can raise your pressure) Relax in a comfortable upright chair Feet on the ground Left arm resting comfortably on a flat surface at the level of your heart Legs uncrossed Back supported Sit quietly and don't talk Place the cuff on your bare arm Adjust snuggly, so that only two fingertips can fit between your skin and the top of the cuff Check 2 readings separated by at least one minute Keep a log of your BP readings For a visual, please reference this diagram: http://ccnc.care/bpdiagram  Provider Name: [facility]     Phone: [phone]  Zone 1: ALL CLEAR  Continue to monitor your symptoms:  BP reading is less than 140 (top number) or less than 90 (bottom number)  No right upper stomach pain No headaches or seeing spots No feeling nauseated or throwing up No swelling in face and hands  Zone 2: CAUTION Call your doctor's office for any of the following:  BP reading is greater than 140 (top number) or greater than  90 (bottom number)  Stomach pain under your ribs in the middle or right side Headaches or seeing spots Feeling nauseated or throwing up Swelling in face and hands  Zone 3: EMERGENCY  Seek immediate medical care if you have any of the following:  BP reading is greater than160 (top number) or greater than 110 (bottom number) Severe headaches not improving with Tylenol Serious difficulty catching your breath Any worsening symptoms from  Zone 2     Second Trimester of Pregnancy The second trimester is from week 14 through week 27 (months 4 through 6). The second trimester is often a time when you feel your best. Your body has adjusted to being pregnant, and you begin to feel better physically. Usually, morning sickness has lessened or quit completely, you may have more energy, and you may have an increase in appetite. The second trimester is also a time when the fetus is growing rapidly. At the end of the sixth month, the fetus is about 9 inches long and weighs about 1 pounds. You will likely begin to feel the baby move (quickening) between 16 and 20 weeks of pregnancy. Body changes during your second trimester Your body continues to go through many changes during your second trimester. The changes vary from woman to woman. Your weight will continue to increase. You will notice your lower abdomen bulging out. You may begin to get stretch marks on your hips, abdomen, and breasts. You may develop headaches that can be relieved by medicines. The medicines should be approved by your health care provider. You may urinate more often because the fetus is pressing on your bladder. You may develop or continue to have heartburn as a result of your pregnancy. You may develop constipation because certain hormones are causing the muscles that push waste through your intestines to slow down. You may develop hemorrhoids or swollen, bulging veins (varicose veins). You may have back pain. This is caused by: Weight gain. Pregnancy hormones that are relaxing the joints in your pelvis. A shift in weight and the muscles that support your balance. Your breasts will continue to grow and they will continue to become tender. Your gums may bleed and may be sensitive to brushing and flossing. Dark spots or blotches (chloasma, mask of pregnancy) may develop on your face. This will likely fade after the baby is born. A dark line from your belly button to  the pubic area (linea nigra) may appear. This will likely fade after the baby is born. You may have changes in your hair. These can include thickening of your hair, rapid growth, and changes in texture. Some women also have hair loss during or after pregnancy, or hair that feels dry or thin. Your hair will most likely return to normal after your baby is born.  What to expect at prenatal visits During a routine prenatal visit: You will be weighed to make sure you and the fetus are growing normally. Your blood pressure will be taken. Your abdomen will be measured to track your baby's growth. The fetal heartbeat will be listened to. Any test results from the previous visit will be discussed.  Your health care provider may ask you: How you are feeling. If you are feeling the baby move. If you have had any abnormal symptoms, such as leaking fluid, bleeding, severe headaches, or abdominal cramping. If you are using any tobacco products, including cigarettes, chewing tobacco, and electronic cigarettes. If you have any questions.  Other tests that may be performed during  your second trimester include: Blood tests that check for: Low iron levels (anemia). High blood sugar that affects pregnant women (gestational diabetes) between 41 and 28 weeks. Rh antibodies. This is to check for a protein on red blood cells (Rh factor). Urine tests to check for infections, diabetes, or protein in the urine. An ultrasound to confirm the proper growth and development of the baby. An amniocentesis to check for possible genetic problems. Fetal screens for spina bifida and Down syndrome. HIV (human immunodeficiency virus) testing. Routine prenatal testing includes screening for HIV, unless you choose not to have this test.  Follow these instructions at home: Medicines Follow your health care provider's instructions regarding medicine use. Specific medicines may be either safe or unsafe to take during  pregnancy. Take a prenatal vitamin that contains at least 600 micrograms (mcg) of folic acid. If you develop constipation, try taking a stool softener if your health care provider approves. Eating and drinking Eat a balanced diet that includes fresh fruits and vegetables, whole grains, good sources of protein such as meat, eggs, or tofu, and low-fat dairy. Your health care provider will help you determine the amount of weight gain that is right for you. Avoid raw meat and uncooked cheese. These carry germs that can cause birth defects in the baby. If you have low calcium intake from food, talk to your health care provider about whether you should take a daily calcium supplement. Limit foods that are high in fat and processed sugars, such as fried and sweet foods. To prevent constipation: Drink enough fluid to keep your urine clear or pale yellow. Eat foods that are high in fiber, such as fresh fruits and vegetables, whole grains, and beans. Activity Exercise only as directed by your health care provider. Most women can continue their usual exercise routine during pregnancy. Try to exercise for 30 minutes at least 5 days a week. Stop exercising if you experience uterine contractions. Avoid heavy lifting, wear low heel shoes, and practice good posture. A sexual relationship may be continued unless your health care provider directs you otherwise. Relieving pain and discomfort Wear a good support bra to prevent discomfort from breast tenderness. Take warm sitz baths to soothe any pain or discomfort caused by hemorrhoids. Use hemorrhoid cream if your health care provider approves. Rest with your legs elevated if you have leg cramps or low back pain. If you develop varicose veins, wear support hose. Elevate your feet for 15 minutes, 3-4 times a day. Limit salt in your diet. Prenatal Care Write down your questions. Take them to your prenatal visits. Keep all your prenatal visits as told by your health  care provider. This is important. Safety Wear your seat belt at all times when driving. Make a list of emergency phone numbers, including numbers for family, friends, the hospital, and police and fire departments. General instructions Ask your health care provider for a referral to a local prenatal education class. Begin classes no later than the beginning of month 6 of your pregnancy. Ask for help if you have counseling or nutritional needs during pregnancy. Your health care provider can offer advice or refer you to specialists for help with various needs. Do not use hot tubs, steam rooms, or saunas. Do not douche or use tampons or scented sanitary pads. Do not cross your legs for long periods of time. Avoid cat litter boxes and soil used by cats. These carry germs that can cause birth defects in the baby and possibly loss of the  fetus by miscarriage or stillbirth. Avoid all smoking, herbs, alcohol, and unprescribed drugs. Chemicals in these products can affect the formation and growth of the baby. Do not use any products that contain nicotine or tobacco, such as cigarettes and e-cigarettes. If you need help quitting, ask your health care provider. Visit your dentist if you have not gone yet during your pregnancy. Use a soft toothbrush to brush your teeth and be gentle when you floss. Contact a health care provider if: You have dizziness. You have mild pelvic cramps, pelvic pressure, or nagging pain in the abdominal area. You have persistent nausea, vomiting, or diarrhea. You have a bad smelling vaginal discharge. You have pain when you urinate. Get help right away if: You have a fever. You are leaking fluid from your vagina. You have spotting or bleeding from your vagina. You have severe abdominal cramping or pain. You have rapid weight gain or weight loss. You have shortness of breath with chest pain. You notice sudden or extreme swelling of your face, hands, ankles, feet, or legs. You  have not felt your baby move in over an hour. You have severe headaches that do not go away when you take medicine. You have vision changes. Summary The second trimester is from week 14 through week 27 (months 4 through 6). It is also a time when the fetus is growing rapidly. Your body goes through many changes during pregnancy. The changes vary from woman to woman. Avoid all smoking, herbs, alcohol, and unprescribed drugs. These chemicals affect the formation and growth your baby. Do not use any tobacco products, such as cigarettes, chewing tobacco, and e-cigarettes. If you need help quitting, ask your health care provider. Contact your health care provider if you have any questions. Keep all prenatal visits as told by your health care provider. This is important. This information is not intended to replace advice given to you by your health care provider. Make sure you discuss any questions you have with your health care provider. Document Released: 04/27/2001 Document Revised: 10/09/2015 Document Reviewed: 07/04/2012 Elsevier Interactive Patient Education  2017 [employer].

## 2023-07-20 ENCOUNTER — Encounter: Payer: Self-pay | Admitting: Advanced Practice Midwife

## 2023-07-20 ENCOUNTER — Ambulatory Visit (INDEPENDENT_AMBULATORY_CARE_PROVIDER_SITE_OTHER): Payer: BC Managed Care – PPO | Admitting: Advanced Practice Midwife

## 2023-07-20 ENCOUNTER — Ambulatory Visit: Payer: BC Managed Care – PPO | Admitting: Radiology

## 2023-07-20 VITALS — BP 122/78 | HR 103 | Wt 301.0 lb

## 2023-07-20 DIAGNOSIS — Z6841 Body Mass Index (BMI) 40.0 and over, adult: Secondary | ICD-10-CM | POA: Diagnosis not present

## 2023-07-20 DIAGNOSIS — O10912 Unspecified pre-existing hypertension complicating pregnancy, second trimester: Secondary | ICD-10-CM | POA: Diagnosis not present

## 2023-07-20 DIAGNOSIS — Z3A22 22 weeks gestation of pregnancy: Secondary | ICD-10-CM

## 2023-07-20 DIAGNOSIS — O0992 Supervision of high risk pregnancy, unspecified, second trimester: Secondary | ICD-10-CM

## 2023-07-20 DIAGNOSIS — Z362 Encounter for other antenatal screening follow-up: Secondary | ICD-10-CM | POA: Diagnosis not present

## 2023-07-20 DIAGNOSIS — O10919 Unspecified pre-existing hypertension complicating pregnancy, unspecified trimester: Secondary | ICD-10-CM

## 2023-07-20 NOTE — Progress Notes (Signed)
 HIGH-RISK PREGNANCY VISIT Patient name: Kerry Johns MRN [id number]  Date of birth: [birth year] Chief Complaint:   Routine Prenatal Visit and Pregnancy Ultrasound  History of Present Illness:   Kerry Johns is a 18 y.o. G43P1001 female at [redacted]w[redacted]d with an Estimated Date of Delivery: 11/20/23 being seen today for ongoing management of a high-risk pregnancy complicated by chronic hypertension currently on no meds; PG BMI 45.    Today she reports no complaints. Contractions: Not present.  .  Movement: Present. denies leaking of fluid.      05/09/2023   11:17 AM 05/19/2022   10:35 AM  Depression screen PHQ 2/9  Decreased Interest 1 1  Down, Depressed, Hopeless 1 0  PHQ - 2 Score 2 1  Altered sleeping 1 1  Tired, decreased energy 1 1  Change in appetite 0 1  Feeling bad or failure about yourself  1 0  Trouble concentrating 1 0  Moving slowly or fidgety/restless 0 0  Suicidal thoughts 0 0  PHQ-9 Score 6 4        05/09/2023   11:17 AM 05/19/2022   10:35 AM  GAD 7 : Generalized Anxiety Score  Nervous, Anxious, on Edge 1 1  Control/stop worrying 1 1  Worry too much - different things 1 1  Trouble relaxing 0 0  Restless 0 1  Easily annoyed or irritable 0 1  Afraid - awful might happen 2 1  Total GAD 7 Score 5 6     Review of Systems:   Pertinent items are noted in HPI Denies abnormal vaginal discharge w/ itching/odor/irritation, headaches, visual changes, shortness of breath, chest pain, abdominal pain, severe nausea/vomiting, or problems with urination or bowel movements unless otherwise stated above. Pertinent History Reviewed:  Reviewed past medical,surgical, social, obstetrical and family history.  Reviewed problem list, medications and allergies. Physical Assessment:   Vitals:   07/20/23 1125  BP: 122/78  Pulse: (!) 103  Weight: (!) 301 lb (136.5 kg)  Body mass index is 46.45 kg/m.           Physical Examination:   General appearance: alert, well appearing,  and in no distress  Mental status: alert, oriented to person, place, and time  Skin: warm & dry   Extremities: Edema: None    Cardiovascular: normal heart rate noted  Respiratory: normal respiratory effort, no distress  Abdomen: gravid, soft, non-tender  Pelvic: Cervical exam deferred         Fetal Status: Fetal Heart Rate (bpm): 143 u/s   Movement: Present    Fetal Surveillance Testing today: [place]:  GA = 22+3 weeks  Single active female fetus, breech prone position, FHR = 143 bpm Posterior pl gr1,  EFW 80%  AC 72%   MVP = 5.1 cm Cardiac screen incomplete - need all cardiac structures documented - decreased resolution due to high BMI and fetal prone position.  Normal ov's -  CL = 4.2 cm,  closed   No results found for this or any previous visit (from the past 24 hours).  Assessment & Plan:  High-risk pregnancy: G2P1001 at [redacted]w[redacted]d with an Estimated Date of Delivery: 11/20/23   1) cHTN, stable without meds; had undesirable side effects from Labetalol> if needs meds, discussed using Procardia; growth u/s standing order in q 4wks  2) PG BMI 45, will need testing @ 34wks  3) Cardiac anatomy not fully seen, will try again at growth next month  Meds: No orders of the defined types were  placed in this encounter.   Labs/procedures today: U/S  Treatment Plan:  growth q 4wks; 34wk start testing  Reviewed: Preterm labor symptoms and general obstetric precautions including but not limited to vaginal bleeding, contractions, leaking of fluid and fetal movement were reviewed in detail with the patient.  All questions were answered. Does have home bp cuff. Office bp cuff given: not applicable. Check bp daily, let us know if consistently >140 and/or >90.  Follow-up: Return in about 4 weeks (around 08/17/2023) for HROB, PN2, [place]: EFW (and f/u cardiac anatomy).   Future Appointments  Date Time Provider Department Center  08/22/2023  8:30 AM [facility]  08/22/2023 10:00 AM [facility] IM[facility] 2  [facility] None  08/22/2023 10:50 AM Cheral Marker, CNM [facility]    Orders Placed This Encounter  Procedures   US OB Follow Up   Arabella Merles [facility] 07/20/2023 11:49 AM

## 2023-07-20 NOTE — Patient Instructions (Signed)
 Kerry Johns, I greatly value your feedback.  If you receive a survey following your visit with [place] today, we appreciate you taking the time to fill it out.  Thanks, Philipp Deputy, CNM   You will have your sugar test next visit.  Please do not eat or drink anything after midnight the night before you come, not even water.  You will be here for at least two hours.  Please make an appointment online for the bloodwork at [email or web address] for 8:30am (or as close to this as possible). Make sure you select the [facility] service center. The day of the appointment, check in with our office first, then you will go to [facility] to start the sugar test.    [facility] HAS MOVED!!! It is now [facility] at [facility] ([address] [place], [place] [address]) Entrance C, located off of [address] valet parking  Go to [email or web address] to register for FREE online childbirth classes   Call the office [phone]) or go to [facility] if: You begin to have strong, frequent contractions Your water breaks.  Sometimes it is a big gush of fluid, sometimes it is just a trickle that keeps getting your panties wet or running down your legs You have vaginal bleeding.  It is normal to have a small amount of spotting if your cervix was checked.  You don't feel your baby moving like normal.  If you don't, get you something to eat and drink and lay down and focus on feeling your baby move.   If your baby is still not moving like normal, you should call the office or go to [facility].  [place] Pediatricians/Family Doctors: [facility] [phone]           [facility] [phone]                [facility] [phone] (usually not accepting new patients unless you have family there already, you are always welcome to call and ask)      [facility] Department [phone]       [facility] Pediatricians/Family Doctors:  [facility]: [phone] [facility]: [phone] [facility]: [phone]  [facility] Doctors:  [facility]: [phone]  [facility]: [phone]  [facility] Doctors: [facility]: [phone]   Home Blood Pressure Monitoring for Patients   Your provider has recommended that you check your blood pressure (BP) at least once a week at home. If you do not have a blood pressure cuff at home, one will be provided for you. Contact your provider if you have not received your monitor within 1 week.   Helpful Tips for Accurate Home Blood Pressure Checks  Don't smoke, exercise, or drink caffeine 30 minutes before checking your BP Use the restroom before checking your BP (a full bladder can raise your pressure) Relax in a comfortable upright chair Feet on the ground Left arm resting comfortably on a flat surface at the level of your heart Legs uncrossed Back supported Sit quietly and don't talk Place the cuff on your bare arm Adjust snuggly, so that only two fingertips can fit between your skin and the top of the cuff Check 2 readings separated by at least one minute Keep a log of your BP readings For a visual, please reference this diagram: http://ccnc.care/bpdiagram  Provider Name: [facility]     Phone: [phone]  Zone 1: ALL CLEAR  Continue to monitor your symptoms:  BP reading is less than 140 (top number) or less than 90 (bottom number)  No right upper stomach pain No headaches or seeing spots No feeling nauseated or throwing up No swelling in face and hands  Zone 2: CAUTION Call your doctor's office for any of the following:  BP reading is greater than 140 (top number) or greater than 90 (bottom number)  Stomach pain under your ribs in the middle or right side Headaches or seeing spots Feeling nauseated or throwing up Swelling in face and hands  Zone 3: EMERGENCY   Seek immediate medical care if you have any of the following:  BP reading is greater than160 (top number) or greater than 110 (bottom number) Severe headaches not improving with Tylenol Serious difficulty catching your breath Any worsening symptoms from Zone 2   Second Trimester of Pregnancy The second trimester is from week 13 through week 28, months 4 through 6. The second trimester is often a time when you feel your best. Your body has also adjusted to being pregnant, and you begin to feel better physically. Usually, morning sickness has lessened or quit completely, you may have more energy, and you may have an increase in appetite. The second trimester is also a time when the fetus is growing rapidly. At the end of the sixth month, the fetus is about 9 inches long and weighs about 1 pounds. You will likely begin to feel the baby move (quickening) between 18 and 20 weeks of the pregnancy. BODY CHANGES Your body goes through many changes during pregnancy. The changes vary from woman to woman.  Your weight will continue to increase. You will notice your lower abdomen bulging out. You may begin to get stretch marks on your hips, abdomen, and breasts. You may develop headaches that can be relieved by medicines approved by your health care provider. You may urinate more often because the fetus is pressing on your bladder. You may develop or continue to have heartburn as a result of your pregnancy. You may develop constipation because certain hormones are causing the muscles that push waste through your intestines to slow down. You may develop hemorrhoids or swollen, bulging veins (varicose veins). You may have back pain because of the weight gain and pregnancy hormones relaxing your joints between the bones in your pelvis and as a result of a shift in weight and the muscles that support your balance. Your breasts will continue to grow and be tender. Your gums may bleed and may be sensitive to  brushing and flossing. Dark spots or blotches (chloasma, mask of pregnancy) may develop on your face. This will likely fade after the baby is born. A dark line from your belly button to the pubic area (linea nigra) may appear. This will likely fade after the baby is born. You may have changes in your hair. These can include thickening of your hair, rapid growth, and changes in texture. Some women also have hair loss during or after pregnancy, or hair that feels dry or thin. Your hair will most likely return to normal after your baby is born. WHAT TO EXPECT AT YOUR PRENATAL VISITS During a routine prenatal visit: You will be weighed to make sure you and the fetus are growing normally. Your blood pressure will be taken. Your abdomen will be measured to track your baby's growth. The fetal heartbeat will be listened to. Any test results from the previous visit will be discussed. Your health care provider  may ask you: How you are feeling. If you are feeling the baby move. If you have had any abnormal symptoms, such as leaking fluid, bleeding, severe headaches, or abdominal cramping. If you have any questions. Other tests that may be performed during your second trimester include: Blood tests that check for: Low iron levels (anemia). Gestational diabetes (between 24 and 28 weeks). Rh antibodies. Urine tests to check for infections, diabetes, or protein in the urine. An ultrasound to confirm the proper growth and development of the baby. An amniocentesis to check for possible genetic problems. Fetal screens for spina bifida and Down syndrome. HOME CARE INSTRUCTIONS  Avoid all smoking, herbs, alcohol, and unprescribed drugs. These chemicals affect the formation and growth of the baby. Follow your health care provider's instructions regarding medicine use. There are medicines that are either safe or unsafe to take during pregnancy. Exercise only as directed by your health care provider.  Experiencing uterine cramps is a good sign to stop exercising. Continue to eat regular, healthy meals. Wear a good support bra for breast tenderness. Do not use hot tubs, steam rooms, or saunas. Wear your seat belt at all times when driving. Avoid raw meat, uncooked cheese, cat litter boxes, and soil used by cats. These carry germs that can cause birth defects in the baby. Take your prenatal vitamins. Try taking a stool softener (if your health care provider approves) if you develop constipation. Eat more high-fiber foods, such as fresh vegetables or fruit and whole grains. Drink plenty of fluids to keep your urine clear or pale yellow. Take warm sitz baths to soothe any pain or discomfort caused by hemorrhoids. Use hemorrhoid cream if your health care provider approves. If you develop varicose veins, wear support hose. Elevate your feet for 15 minutes, 3-4 times a day. Limit salt in your diet. Avoid heavy lifting, wear low heel shoes, and practice good posture. Rest with your legs elevated if you have leg cramps or low back pain. Visit your dentist if you have not gone yet during your pregnancy. Use a soft toothbrush to brush your teeth and be gentle when you floss. A sexual relationship may be continued unless your health care provider directs you otherwise. Continue to go to all your prenatal visits as directed by your health care provider. SEEK MEDICAL CARE IF:  You have dizziness. You have mild pelvic cramps, pelvic pressure, or nagging pain in the abdominal area. You have persistent nausea, vomiting, or diarrhea. You have a bad smelling vaginal discharge. You have pain with urination. SEEK IMMEDIATE MEDICAL CARE IF:  You have a fever. You are leaking fluid from your vagina. You have spotting or bleeding from your vagina. You have severe abdominal cramping or pain. You have rapid weight gain or loss. You have shortness of breath with chest pain. You notice sudden or extreme swelling  of your face, hands, ankles, feet, or legs. You have not felt your baby move in over an hour. You have severe headaches that do not go away with medicine. You have vision changes. Document Released: 04/27/2001 Document Revised: 05/08/2013 Document Reviewed: 07/04/2012 [facility] Patient Information 2015 [place], [place]. This information is not intended to replace advice given to you by your health care provider. Make sure you discuss any questions you have with your health care provider.

## 2023-07-20 NOTE — Progress Notes (Signed)
 [place]:  GA = 22+3 weeks  Single active female fetus, breech prone position, FHR = 143 bpm Posterior pl gr1,  EFW 80%  AC 72% Cardiac screen incomplete - need all cardiac structures documented - decreased resolution due to high BMI and fetal prone position.  Normal ov's -  CL = 4.2 cm,  closed

## 2023-08-08 ENCOUNTER — Other Ambulatory Visit: Payer: Self-pay | Admitting: Advanced Practice Midwife

## 2023-08-11 ENCOUNTER — Encounter: Payer: Self-pay | Admitting: Advanced Practice Midwife

## 2023-08-22 ENCOUNTER — Other Ambulatory Visit

## 2023-08-22 ENCOUNTER — Ambulatory Visit: Admitting: Radiology

## 2023-08-22 ENCOUNTER — Ambulatory Visit: Admitting: Advanced Practice Midwife

## 2023-08-22 ENCOUNTER — Encounter: Payer: Self-pay | Admitting: Advanced Practice Midwife

## 2023-08-22 VITALS — BP 134/82 | HR 111 | Wt 304.0 lb

## 2023-08-22 DIAGNOSIS — O10919 Unspecified pre-existing hypertension complicating pregnancy, unspecified trimester: Secondary | ICD-10-CM

## 2023-08-22 DIAGNOSIS — O10912 Unspecified pre-existing hypertension complicating pregnancy, second trimester: Secondary | ICD-10-CM

## 2023-08-22 DIAGNOSIS — O0993 Supervision of high risk pregnancy, unspecified, third trimester: Secondary | ICD-10-CM | POA: Diagnosis not present

## 2023-08-22 DIAGNOSIS — O0992 Supervision of high risk pregnancy, unspecified, second trimester: Secondary | ICD-10-CM

## 2023-08-22 DIAGNOSIS — Z3A26 26 weeks gestation of pregnancy: Secondary | ICD-10-CM

## 2023-08-22 DIAGNOSIS — Z3A27 27 weeks gestation of pregnancy: Secondary | ICD-10-CM

## 2023-08-22 DIAGNOSIS — Z131 Encounter for screening for diabetes mellitus: Secondary | ICD-10-CM

## 2023-08-22 NOTE — Progress Notes (Signed)
 HIGH-RISK PREGNANCY VISIT Patient name: Kerry Johns MRN [id number]  Date of birth: 2006-02-21 Chief Complaint:   Routine Prenatal Visit (Having some braxton hicks)  History of Present Illness:   Kerry Johns is a 18 y.o. G85P1001 female at [redacted]w[redacted]d with an Estimated Date of Delivery: 11/20/23 being seen today for ongoing management of a high-risk pregnancy complicated by chronic hypertension currently on no meds.    Today she reports no complaints. Contractions: Irritability. Vag. Bleeding: None.  Movement: Present. denies leaking of fluid.      05/09/2023   11:17 AM 05/19/2022   10:35 AM  Depression screen PHQ 2/9  Decreased Interest 1 1  Down, Depressed, Hopeless 1 0  PHQ - 2 Score 2 1  Altered sleeping 1 1  Tired, decreased energy 1 1  Change in appetite 0 1  Feeling bad or failure about yourself  1 0  Trouble concentrating 1 0  Moving slowly or fidgety/restless 0 0  Suicidal thoughts 0 0  PHQ-9 Score 6 4        05/09/2023   11:17 AM 05/19/2022   10:35 AM  GAD 7 : Generalized Anxiety Score  Nervous, Anxious, on Edge 1 1  Control/stop worrying 1 1  Worry too much - different things 1 1  Trouble relaxing 0 0  Restless 0 1  Easily annoyed or irritable 0 1  Afraid - awful might happen 2 1  Total GAD 7 Score 5 6     Review of Systems:   Pertinent items are noted in HPI Denies abnormal vaginal discharge w/ itching/odor/irritation, headaches, visual changes, shortness of breath, chest pain, abdominal pain, severe nausea/vomiting, or problems with urination or bowel movements unless otherwise stated above. Pertinent History Reviewed:  Reviewed past medical,surgical, social, obstetrical and family history.  Reviewed problem list, medications and allergies. Physical Assessment:   Vitals:   08/22/23 1037  BP: 134/82  Pulse: (!) 111  Weight: (!) 304 lb (137.9 kg)  Body mass index is 46.91 kg/m.           Physical Examination:   General appearance: alert, well  appearing, and in no distress  Mental status: alert, oriented to person, place, and time  Skin: warm & dry   Extremities: Edema: None    Cardiovascular: normal heart rate noted  Respiratory: normal respiratory effort, no distress  Abdomen: gravid, soft, non-tender  Pelvic: Cervical exam deferred         Fetal Status: Fetal Heart Rate (bpm): 150 u/s   Movement: Present    Fetal Surveillance Testing today: [place]: GA = 27+1 weeks Single active fetus, breech presentation, FHR = 150 bpm,  posterior pl, gr1 AFI = 15.3 cm,  55 %, MVP = 5.2 cm,  EFW 68% 1144g  Incomplete cardiac screen    No results found for this or any previous visit (from the past 24 hours).  Assessment & Plan:  High-risk pregnancy: G2P1001 at [redacted]w[redacted]d with an Estimated Date of Delivery: 11/20/23   1) cHTN, stable without meds; nl BPs at home as well  2) PG BMI 45, will need testing @ 34wks  3) Rh neg, needs Rhogam @ [place]  Meds: No orders of the defined types were placed in this encounter.   Labs/procedures today: U/S and PN2  Treatment Plan:  growth q 4wks; testing @ 34wks  Reviewed: Preterm labor symptoms and general obstetric precautions including but not limited to vaginal bleeding, contractions, leaking of fluid and fetal movement were reviewed in  detail with the patient.  All questions were answered. Does have home bp cuff. Office bp cuff given: not applicable. Check bp daily, let us know if consistently >140 and/or >90.  Follow-up: Return for 2wk HROB & Rhogam; 4wk  HROB & u/s EFW; 8wk HROB & u/s EFW & 2x/wk testing.   No future appointments.   No orders of the defined types were placed in this encounter.  Arabella Merles CNM 08/22/2023 10:57 AM

## 2023-08-22 NOTE — Patient Instructions (Signed)
 Evia, thank you for choosing our office today! We appreciate the opportunity to meet your healthcare needs. You may receive a short survey by mail, e-mail, or through [employer]. If you are happy with your care we would appreciate if you could take just a few minutes to complete the survey questions. We read all of your comments and take your feedback very seriously. Thank you again for choosing our office.  Center for [employer] Team at [facility]  [facility] at [facility] ([address] [place], [place] [address]) Entrance C, located off of [address] Free 24/7 valet parking   CLASSES: Go to [email or web address] to register for classes (childbirth, breastfeeding, waterbirth, infant CPR, daddy bootcamp, etc.)  Call the office [phone]) or go to [facility] if: You begin to have strong, frequent contractions Your water breaks.  Sometimes it is a big gush of fluid, sometimes it is just a trickle that keeps getting your panties wet or running down your legs You have vaginal bleeding.  It is normal to have a small amount of spotting if your cervix was checked.  You don't feel your baby moving like normal.  If you don't, get you something to eat and drink and lay down and focus on feeling your baby move.   If your baby is still not moving like normal, you should call the office or go to [facility].  Call the office [phone]) or go to [facility] hospital for these signs of pre-eclampsia: Severe headache that does not go away with Tylenol Visual changes- seeing spots, double, blurred vision Pain under your right breast or upper abdomen that does not go away with Tums or heartburn medicine Nausea and/or vomiting Severe swelling in your hands, feet, and face   Tdap Vaccine It is recommended that you get the Tdap vaccine during the third trimester of EACH pregnancy to help protect your baby from getting pertussis (whooping cough) 27-36 weeks is the BEST time to do  this so that you can pass the protection on to your baby. During pregnancy is better than after pregnancy, but if you are unable to get it during pregnancy it will be offered at the hospital.  You can get this vaccine with [place], at the health department, your family doctor, or some local pharmacies Everyone who will be around your baby should also be up-to-date on their vaccines before the baby comes. Adults (who are not pregnant) only need 1 dose of Tdap during adulthood.   [facility] Pediatricians/Family Doctors [facility]): [address] Colette Ribas, [phone]           [facility]: [address], [phone]                [facility] Medicine [facility]): [address], [phone] (call to ask if accepting patients) [facility] Department: [address], [place], [phone]    [facility] Pediatricians/Family Doctors [facility]): [phone], [phone] [facility]: [address] [place], [phone] [facility]: [address], [phone]  [facility] Doctors  [facility]): [phone] [facility]: [address] [phone]   [facility] Doctors [facility]: [address], [phone]  [facility] Family Doctors  [facility]: [phone], [phone]  Home Blood Pressure Monitoring for Patients   Your provider has recommended that you check your  blood pressure (BP) at least once a week at home. If you do not have a blood pressure cuff at home, one will be provided for you. Contact your provider if you have not received your monitor within 1 week.   Helpful Tips for Accurate Home Blood Pressure Checks  Don't smoke, exercise, or drink caffeine 30 minutes before checking your BP Use the restroom before checking your BP (a full bladder can raise your  pressure) Relax in a comfortable upright chair Feet on the ground Left arm resting comfortably on a flat surface at the level of your heart Legs uncrossed Back supported Sit quietly and don't talk Place the cuff on your bare arm Adjust snuggly, so that only two fingertips can fit between your skin and the top of the cuff Check 2 readings separated by at least one minute Keep a log of your BP readings For a visual, please reference this diagram: http://ccnc.care/bpdiagram  Provider Name: [facility]     Phone: [phone]  Zone 1: ALL CLEAR  Continue to monitor your symptoms:  BP reading is less than 140 (top number) or less than 90 (bottom number)  No right upper stomach pain No headaches or seeing spots No feeling nauseated or throwing up No swelling in face and hands  Zone 2: CAUTION Call your doctor's office for any of the following:  BP reading is greater than 140 (top number) or greater than 90 (bottom number)  Stomach pain under your ribs in the middle or right side Headaches or seeing spots Feeling nauseated or throwing up Swelling in face and hands  Zone 3: EMERGENCY  Seek immediate medical care if you have any of the following:  BP reading is greater than160 (top number) or greater than 110 (bottom number) Severe headaches not improving with Tylenol Serious difficulty catching your breath Any worsening symptoms from Zone 2   Third Trimester of Pregnancy The third trimester is from week 29 through week 42, months 7 through 9. The third trimester is a time when the fetus is growing rapidly. At the end of the ninth month, the fetus is about 20 inches in length and weighs 6-10 pounds.  BODY CHANGES Your body goes through many changes during pregnancy. The changes vary from woman to woman.  Your weight will continue to increase. You can expect to gain 25-35 pounds (11-16 kg) by the end of the pregnancy. You may begin to get stretch marks on your hips, abdomen,  and breasts. You may urinate more often because the fetus is moving lower into your pelvis and pressing on your bladder. You may develop or continue to have heartburn as a result of your pregnancy. You may develop constipation because certain hormones are causing the muscles that push waste through your intestines to slow down. You may develop hemorrhoids or swollen, bulging veins (varicose veins). You may have pelvic pain because of the weight gain and pregnancy hormones relaxing your joints between the bones in your pelvis. Backaches may result from overexertion of the muscles supporting your posture. You may have changes in your hair. These can include thickening of your hair, rapid growth, and changes in texture. Some women also have hair loss during or after pregnancy, or hair that feels dry or thin. Your hair will most likely return to normal after your baby is born. Your breasts will continue to grow and be tender. A yellow discharge may leak from your breasts called colostrum. Your belly button may stick out. You may  feel short of breath because of your expanding uterus. You may notice the fetus "dropping," or moving lower in your abdomen. You may have a bloody mucus discharge. This usually occurs a few days to a week before labor begins. Your cervix becomes thin and soft (effaced) near your due date. WHAT TO EXPECT AT YOUR PRENATAL EXAMS  You will have prenatal exams every 2 weeks until week 36. Then, you will have weekly prenatal exams. During a routine prenatal visit: You will be weighed to make sure you and the fetus are growing normally. Your blood pressure is taken. Your abdomen will be measured to track your baby's growth. The fetal heartbeat will be listened to. Any test results from the previous visit will be discussed. You may have a cervical check near your due date to see if you have effaced. At around 36 weeks, your caregiver will check your cervix. At the same time, your  caregiver will also perform a test on the secretions of the vaginal tissue. This test is to determine if a type of bacteria, Group B streptococcus, is present. Your caregiver will explain this further. Your caregiver may ask you: What your birth plan is. How you are feeling. If you are feeling the baby move. If you have had any abnormal symptoms, such as leaking fluid, bleeding, severe headaches, or abdominal cramping. If you have any questions. Other tests or screenings that may be performed during your third trimester include: Blood tests that check for low iron levels (anemia). Fetal testing to check the health, activity level, and growth of the fetus. Testing is done if you have certain medical conditions or if there are problems during the pregnancy. FALSE LABOR You may feel small, irregular contractions that eventually go away. These are called Braxton Hicks contractions, or false labor. Contractions may last for hours, days, or even weeks before true labor sets in. If contractions come at regular intervals, intensify, or become painful, it is best to be seen by your caregiver.  SIGNS OF LABOR  Menstrual-like cramps. Contractions that are 5 minutes apart or less. Contractions that start on the top of the uterus and spread down to the lower abdomen and back. A sense of increased pelvic pressure or back pain. A watery or bloody mucus discharge that comes from the vagina. If you have any of these signs before the 37th week of pregnancy, call your caregiver right away. You need to go to the hospital to get checked immediately. HOME CARE INSTRUCTIONS  Avoid all smoking, herbs, alcohol, and unprescribed drugs. These chemicals affect the formation and growth of the baby. Follow your caregiver's instructions regarding medicine use. There are medicines that are either safe or unsafe to take during pregnancy. Exercise only as directed by your caregiver. Experiencing uterine cramps is a good sign to  stop exercising. Continue to eat regular, healthy meals. Wear a good support bra for breast tenderness. Do not use hot tubs, steam rooms, or saunas. Wear your seat belt at all times when driving. Avoid raw meat, uncooked cheese, cat litter boxes, and soil used by cats. These carry germs that can cause birth defects in the baby. Take your prenatal vitamins. Try taking a stool softener (if your caregiver approves) if you develop constipation. Eat more high-fiber foods, such as fresh vegetables or fruit and whole grains. Drink plenty of fluids to keep your urine clear or pale yellow. Take warm sitz baths to soothe any pain or discomfort caused by hemorrhoids. Use hemorrhoid cream if  your caregiver approves. If you develop varicose veins, wear support hose. Elevate your feet for 15 minutes, 3-4 times a day. Limit salt in your diet. Avoid heavy lifting, wear low heal shoes, and practice good posture. Rest a lot with your legs elevated if you have leg cramps or low back pain. Visit your dentist if you have not gone during your pregnancy. Use a soft toothbrush to brush your teeth and be gentle when you floss. A sexual relationship may be continued unless your caregiver directs you otherwise. Do not travel far distances unless it is absolutely necessary and only with the approval of your caregiver. Take prenatal classes to understand, practice, and ask questions about the labor and delivery. Make a trial run to the hospital. Pack your hospital bag. Prepare the baby's nursery. Continue to go to all your prenatal visits as directed by your caregiver. SEEK MEDICAL CARE IF: You are unsure if you are in labor or if your water has broken. You have dizziness. You have mild pelvic cramps, pelvic pressure, or nagging pain in your abdominal area. You have persistent nausea, vomiting, or diarrhea. You have a bad smelling vaginal discharge. You have pain with urination. SEEK IMMEDIATE MEDICAL CARE IF:  You  have a fever. You are leaking fluid from your vagina. You have spotting or bleeding from your vagina. You have severe abdominal cramping or pain. You have rapid weight loss or gain. You have shortness of breath with chest pain. You notice sudden or extreme swelling of your face, hands, ankles, feet, or legs. You have not felt your baby move in over an hour. You have severe headaches that do not go away with medicine. You have vision changes. Document Released: 04/27/2001 Document Revised: 05/08/2013 Document Reviewed: 07/04/2012 [facility] Patient Information 2015 [place], [place]. This information is not intended to replace advice given to you by your health care provider. Make sure you discuss any questions you have with your health care provider.

## 2023-08-22 NOTE — Progress Notes (Signed)
 [place]: GA = 27+1 weeks Single active fetus, breech presentation, FHR = 150 bpm,  posterior pl, gr1 AFI = 15.3 cm,  55 %, MVP = 5.2 cm,  EFW 68% 1144g

## 2023-08-23 LAB — HIV ANTIBODY (ROUTINE TESTING W REFLEX): HIV Screen 4th Generation wRfx: NONREACTIVE

## 2023-08-23 LAB — GLUCOSE TOLERANCE, 2 HOURS W/ 1HR
Glucose, 1 hour: 108 mg/dL (ref 70–179)
Glucose, 2 hour: 105 mg/dL (ref 70–152)
Glucose, Fasting: 84 mg/dL (ref 70–91)

## 2023-08-23 LAB — RPR: RPR Ser Ql: NONREACTIVE

## 2023-08-23 LAB — CBC
Hematocrit: 33.7 % — ABNORMAL LOW (ref 34.0–46.6)
Hemoglobin: 10.8 g/dL — ABNORMAL LOW (ref 11.1–15.9)
MCH: 29.7 pg (ref 26.6–33.0)
MCHC: 32 g/dL (ref 31.5–35.7)
MCV: 93 fL (ref 79–97)
Platelets: 186 10*3/uL (ref 150–450)
RBC: 3.64 x10E6/uL — ABNORMAL LOW (ref 3.77–5.28)
RDW: 13.1 % (ref 11.7–15.4)
WBC: 6.8 10*3/uL (ref 3.4–10.8)

## 2023-08-23 LAB — ANTIBODY SCREEN: Antibody Screen: NEGATIVE

## 2023-08-24 ENCOUNTER — Encounter: Payer: Self-pay | Admitting: Advanced Practice Midwife

## 2023-09-05 ENCOUNTER — Encounter: Payer: Self-pay | Admitting: Women's Health

## 2023-09-05 ENCOUNTER — Other Ambulatory Visit (HOSPITAL_COMMUNITY)
Admission: RE | Admit: 2023-09-05 | Discharge: 2023-09-05 | Disposition: A | Discharge status: Home / Self Care | Source: Ambulatory Visit | Attending: Women's Health | Admitting: Women's Health

## 2023-09-05 ENCOUNTER — Ambulatory Visit: Admitting: Women's Health

## 2023-09-05 VITALS — BP 129/84 | HR 121 | Wt 301.5 lb

## 2023-09-05 DIAGNOSIS — O10919 Unspecified pre-existing hypertension complicating pregnancy, unspecified trimester: Secondary | ICD-10-CM

## 2023-09-05 DIAGNOSIS — N898 Other specified noninflammatory disorders of vagina: Secondary | ICD-10-CM | POA: Diagnosis present

## 2023-09-05 DIAGNOSIS — O26893 Other specified pregnancy related conditions, third trimester: Secondary | ICD-10-CM | POA: Insufficient documentation

## 2023-09-05 DIAGNOSIS — Z3A00 Weeks of gestation of pregnancy not specified: Secondary | ICD-10-CM | POA: Diagnosis not present

## 2023-09-05 DIAGNOSIS — Z6791 Unspecified blood type, Rh negative: Secondary | ICD-10-CM

## 2023-09-05 DIAGNOSIS — Z3A29 29 weeks gestation of pregnancy: Secondary | ICD-10-CM

## 2023-09-05 DIAGNOSIS — O0993 Supervision of high risk pregnancy, unspecified, third trimester: Secondary | ICD-10-CM

## 2023-09-05 DIAGNOSIS — Z6841 Body Mass Index (BMI) 40.0 and over, adult: Secondary | ICD-10-CM

## 2023-09-05 DIAGNOSIS — O10913 Unspecified pre-existing hypertension complicating pregnancy, third trimester: Secondary | ICD-10-CM

## 2023-09-05 NOTE — Progress Notes (Signed)
 HIGH-RISK PREGNANCY VISIT Patient name: Kerry Johns MRN [id number]  Date of birth: Jan 03, 2006 Chief Complaint:   High Risk Gestation (Rhogam today!!! )  History of Present Illness:   Kerry Johns is a 18 y.o. G48P1001 female at [redacted]w[redacted]d with an Estimated Date of Delivery: 11/20/23 being seen today for ongoing management of a high-risk pregnancy complicated by chronic hypertension currently on no meds and PG BMI 45.    Today she reports  watery d/c, some strong odor x , no itching/irritation . Contractions: Irritability. Vag. Bleeding: None.  Movement: Present.      05/09/2023   11:17 AM 05/19/2022   10:35 AM  Depression screen PHQ 2/9  Decreased Interest 1 1  Down, Depressed, Hopeless 1 0  PHQ - 2 Score 2 1  Altered sleeping 1 1  Tired, decreased energy 1 1  Change in appetite 0 1  Feeling bad or failure about yourself  1 0  Trouble concentrating 1 0  Moving slowly or fidgety/restless 0 0  Suicidal thoughts 0 0  PHQ-9 Score 6 4        05/09/2023   11:17 AM 05/19/2022   10:35 AM  GAD 7 : Generalized Anxiety Score  Nervous, Anxious, on Edge 1 1  Control/stop worrying 1 1  Worry too much - different things 1 1  Trouble relaxing 0 0  Restless 0 1  Easily annoyed or irritable 0 1  Afraid - awful might happen 2 1  Total GAD 7 Score 5 6     Review of Systems:   Pertinent items are noted in HPI Denies abnormal vaginal discharge w/ itching/odor/irritation, headaches, visual changes, shortness of breath, chest pain, abdominal pain, severe nausea/vomiting, or problems with urination or bowel movements unless otherwise stated above. Pertinent History Reviewed:  Reviewed past medical,surgical, social, obstetrical and family history.  Reviewed problem list, medications and allergies. Physical Assessment:   Vitals:   09/05/23 1431  BP: 129/84  Pulse: (!) 121  Weight: (!) 301 lb 8 oz (136.8 kg)  Body mass index is 46.52 kg/m.           Physical Examination:    General appearance: alert, well appearing, and in no distress  Mental status: alert, oriented to person, place, and time  Skin: warm & dry   Extremities: Edema: Trace    Cardiovascular: normal heart rate noted  Respiratory: normal respiratory effort, no distress  Abdomen: gravid, soft, non-tender  Pelvic:  SSE: cx visually closed, no pooling, no change w/ valsalva, CV swab collected          Fetal Status: Fetal Heart Rate (bpm): 144 Fundal Height: 30 cm Movement: Present    Fetal Surveillance Testing today: doppler   Chaperone: Lorean Rodes  No results found for this or any previous visit (from the past 24 hours).  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  Labs/procedures today: spec exam and CV swab, Rhogam  Treatment Plan:  EFW q 4w    2x/wk nst or weekly BPP @ 34wks     Deliver @ 38-39.6wks:______   Reviewed: Preterm labor symptoms and general obstetric precautions including but not limited to vaginal bleeding, contractions, leaking of fluid and fetal movement were  reviewed in detail with the patient.  All questions were answered. Does have home bp cuff. Office bp cuff given: not applicable. Check bp weekly, let us  know if consistently >140 and/or >90.  Follow-up: Return in about 2 weeks (around 09/19/2023) for As scheduled.   Future Appointments  Date Time Provider Department Center  09/19/2023  2:15 PM [facility] - FT IMG 2 [facility] None  09/19/2023  3:10 PM Ferd Householder, CNM [facility]  10/17/2023  1:30 PM [facility] - [facility] IMG 2 [facility] None  10/17/2023  2:30 PM Keene Past[place], DO [facility]  10/20/2023  2:10 PM [facility] NURSE [facility]  10/24/2023  1:30 PM [facility] - [facility] US  [facility] None  10/24/2023  2:30 PM Ferd Householder, CNM [facility]  10/27/2023  2:30 PM [facility] NURSE [facility]  10/31/2023  2:15 PM [facility] - FT IMG 2 [facility] None  10/31/2023  3:10 PM Ferd Householder, CNM [facility]  11/03/2023 10:10 AM [facility] NURSE [facility]  11/07/2023  1:30 PM [facility] - [facility] US  [facility] None  11/07/2023  2:30 PM Keene Pastures, DO [facility]  11/10/2023  2:10 PM [facility] NURSE [facility]  11/14/2023 10:45 AM [facility] - [facility] US  [facility] None  11/14/2023 11:30 AM Ferd Householder, CNM [facility]  11/17/2023  2:10 PM [facility] NURSE [facility]    Orders Placed This Encounter  Procedures   US  FETAL BPP WO NON STRESS   RHO (D) Immune Globulin    Ferd Householder CNM, [facility] 09/05/2023 2:57 PM

## 2023-09-05 NOTE — Patient Instructions (Signed)
 Evia, thank you for choosing our office today! We appreciate the opportunity to meet your healthcare needs. You may receive a short survey by mail, e-mail, or through [employer]. If you are happy with your care we would appreciate if you could take just a few minutes to complete the survey questions. We read all of your comments and take your feedback very seriously. Thank you again for choosing our office.  Center for [employer] Team at [facility]  [facility] at [facility] ([address] [place], [place] [address]) Entrance C, located off of [address] Free 24/7 valet parking   CLASSES: Go to [email or web address] to register for classes (childbirth, breastfeeding, waterbirth, infant CPR, daddy bootcamp, etc.)  Call the office [phone]) or go to [facility] if: You begin to have strong, frequent contractions Your water breaks.  Sometimes it is a big gush of fluid, sometimes it is just a trickle that keeps getting your panties wet or running down your legs You have vaginal bleeding.  It is normal to have a small amount of spotting if your cervix was checked.  You don't feel your baby moving like normal.  If you don't, get you something to eat and drink and lay down and focus on feeling your baby move.   If your baby is still not moving like normal, you should call the office or go to [facility].  Call the office [phone]) or go to [facility] hospital for these signs of pre-eclampsia: Severe headache that does not go away with Tylenol Visual changes- seeing spots, double, blurred vision Pain under your right breast or upper abdomen that does not go away with Tums or heartburn medicine Nausea and/or vomiting Severe swelling in your hands, feet, and face   Tdap Vaccine It is recommended that you get the Tdap vaccine during the third trimester of EACH pregnancy to help protect your baby from getting pertussis (whooping cough) 27-36 weeks is the BEST time to do  this so that you can pass the protection on to your baby. During pregnancy is better than after pregnancy, but if you are unable to get it during pregnancy it will be offered at the hospital.  You can get this vaccine with [place], at the health department, your family doctor, or some local pharmacies Everyone who will be around your baby should also be up-to-date on their vaccines before the baby comes. Adults (who are not pregnant) only need 1 dose of Tdap during adulthood.   [facility] Pediatricians/Family Doctors [facility]): [address] Colette Ribas, [phone]           [facility]: [address], [phone]                [facility] Medicine [facility]): [address], [phone] (call to ask if accepting patients) [facility] Department: [address], [place], [phone]    [facility] Pediatricians/Family Doctors [facility]): [phone], [phone] [facility]: [address] [place], [phone] [facility]: [address], [phone]  [facility] Doctors  [facility]): [phone] [facility]: [address] [phone]   [facility] Doctors [facility]: [address], [phone]  [facility] Family Doctors  [facility]: [phone], [phone]  Home Blood Pressure Monitoring for Patients   Your provider has recommended that you check your  blood pressure (BP) at least once a week at home. If you do not have a blood pressure cuff at home, one will be provided for you. Contact your provider if you have not received your monitor within 1 week.   Helpful Tips for Accurate Home Blood Pressure Checks  Don't smoke, exercise, or drink caffeine 30 minutes before checking your BP Use the restroom before checking your BP (a full bladder can raise your  pressure) Relax in a comfortable upright chair Feet on the ground Left arm resting comfortably on a flat surface at the level of your heart Legs uncrossed Back supported Sit quietly and don't talk Place the cuff on your bare arm Adjust snuggly, so that only two fingertips can fit between your skin and the top of the cuff Check 2 readings separated by at least one minute Keep a log of your BP readings For a visual, please reference this diagram: http://ccnc.care/bpdiagram  Provider Name: [facility]     Phone: [phone]  Zone 1: ALL CLEAR  Continue to monitor your symptoms:  BP reading is less than 140 (top number) or less than 90 (bottom number)  No right upper stomach pain No headaches or seeing spots No feeling nauseated or throwing up No swelling in face and hands  Zone 2: CAUTION Call your doctor's office for any of the following:  BP reading is greater than 140 (top number) or greater than 90 (bottom number)  Stomach pain under your ribs in the middle or right side Headaches or seeing spots Feeling nauseated or throwing up Swelling in face and hands  Zone 3: EMERGENCY  Seek immediate medical care if you have any of the following:  BP reading is greater than160 (top number) or greater than 110 (bottom number) Severe headaches not improving with Tylenol Serious difficulty catching your breath Any worsening symptoms from Zone 2   Third Trimester of Pregnancy The third trimester is from week 29 through week 42, months 7 through 9. The third trimester is a time when the fetus is growing rapidly. At the end of the ninth month, the fetus is about 20 inches in length and weighs 6-10 pounds.  BODY CHANGES Your body goes through many changes during pregnancy. The changes vary from woman to woman.  Your weight will continue to increase. You can expect to gain 25-35 pounds (11-16 kg) by the end of the pregnancy. You may begin to get stretch marks on your hips, abdomen,  and breasts. You may urinate more often because the fetus is moving lower into your pelvis and pressing on your bladder. You may develop or continue to have heartburn as a result of your pregnancy. You may develop constipation because certain hormones are causing the muscles that push waste through your intestines to slow down. You may develop hemorrhoids or swollen, bulging veins (varicose veins). You may have pelvic pain because of the weight gain and pregnancy hormones relaxing your joints between the bones in your pelvis. Backaches may result from overexertion of the muscles supporting your posture. You may have changes in your hair. These can include thickening of your hair, rapid growth, and changes in texture. Some women also have hair loss during or after pregnancy, or hair that feels dry or thin. Your hair will most likely return to normal after your baby is born. Your breasts will continue to grow and be tender. A yellow discharge may leak from your breasts called colostrum. Your belly button may stick out. You may  feel short of breath because of your expanding uterus. You may notice the fetus "dropping," or moving lower in your abdomen. You may have a bloody mucus discharge. This usually occurs a few days to a week before labor begins. Your cervix becomes thin and soft (effaced) near your due date. WHAT TO EXPECT AT YOUR PRENATAL EXAMS  You will have prenatal exams every 2 weeks until week 36. Then, you will have weekly prenatal exams. During a routine prenatal visit: You will be weighed to make sure you and the fetus are growing normally. Your blood pressure is taken. Your abdomen will be measured to track your baby's growth. The fetal heartbeat will be listened to. Any test results from the previous visit will be discussed. You may have a cervical check near your due date to see if you have effaced. At around 36 weeks, your caregiver will check your cervix. At the same time, your  caregiver will also perform a test on the secretions of the vaginal tissue. This test is to determine if a type of bacteria, Group B streptococcus, is present. Your caregiver will explain this further. Your caregiver may ask you: What your birth plan is. How you are feeling. If you are feeling the baby move. If you have had any abnormal symptoms, such as leaking fluid, bleeding, severe headaches, or abdominal cramping. If you have any questions. Other tests or screenings that may be performed during your third trimester include: Blood tests that check for low iron levels (anemia). Fetal testing to check the health, activity level, and growth of the fetus. Testing is done if you have certain medical conditions or if there are problems during the pregnancy. FALSE LABOR You may feel small, irregular contractions that eventually go away. These are called Braxton Hicks contractions, or false labor. Contractions may last for hours, days, or even weeks before true labor sets in. If contractions come at regular intervals, intensify, or become painful, it is best to be seen by your caregiver.  SIGNS OF LABOR  Menstrual-like cramps. Contractions that are 5 minutes apart or less. Contractions that start on the top of the uterus and spread down to the lower abdomen and back. A sense of increased pelvic pressure or back pain. A watery or bloody mucus discharge that comes from the vagina. If you have any of these signs before the 37th week of pregnancy, call your caregiver right away. You need to go to the hospital to get checked immediately. HOME CARE INSTRUCTIONS  Avoid all smoking, herbs, alcohol, and unprescribed drugs. These chemicals affect the formation and growth of the baby. Follow your caregiver's instructions regarding medicine use. There are medicines that are either safe or unsafe to take during pregnancy. Exercise only as directed by your caregiver. Experiencing uterine cramps is a good sign to  stop exercising. Continue to eat regular, healthy meals. Wear a good support bra for breast tenderness. Do not use hot tubs, steam rooms, or saunas. Wear your seat belt at all times when driving. Avoid raw meat, uncooked cheese, cat litter boxes, and soil used by cats. These carry germs that can cause birth defects in the baby. Take your prenatal vitamins. Try taking a stool softener (if your caregiver approves) if you develop constipation. Eat more high-fiber foods, such as fresh vegetables or fruit and whole grains. Drink plenty of fluids to keep your urine clear or pale yellow. Take warm sitz baths to soothe any pain or discomfort caused by hemorrhoids. Use hemorrhoid cream if  your caregiver approves. If you develop varicose veins, wear support hose. Elevate your feet for 15 minutes, 3-4 times a day. Limit salt in your diet. Avoid heavy lifting, wear low heal shoes, and practice good posture. Rest a lot with your legs elevated if you have leg cramps or low back pain. Visit your dentist if you have not gone during your pregnancy. Use a soft toothbrush to brush your teeth and be gentle when you floss. A sexual relationship may be continued unless your caregiver directs you otherwise. Do not travel far distances unless it is absolutely necessary and only with the approval of your caregiver. Take prenatal classes to understand, practice, and ask questions about the labor and delivery. Make a trial run to the hospital. Pack your hospital bag. Prepare the baby's nursery. Continue to go to all your prenatal visits as directed by your caregiver. SEEK MEDICAL CARE IF: You are unsure if you are in labor or if your water has broken. You have dizziness. You have mild pelvic cramps, pelvic pressure, or nagging pain in your abdominal area. You have persistent nausea, vomiting, or diarrhea. You have a bad smelling vaginal discharge. You have pain with urination. SEEK IMMEDIATE MEDICAL CARE IF:  You  have a fever. You are leaking fluid from your vagina. You have spotting or bleeding from your vagina. You have severe abdominal cramping or pain. You have rapid weight loss or gain. You have shortness of breath with chest pain. You notice sudden or extreme swelling of your face, hands, ankles, feet, or legs. You have not felt your baby move in over an hour. You have severe headaches that do not go away with medicine. You have vision changes. Document Released: 04/27/2001 Document Revised: 05/08/2013 Document Reviewed: 07/04/2012 [facility] Patient Information 2015 [place], [place]. This information is not intended to replace advice given to you by your health care provider. Make sure you discuss any questions you have with your health care provider.

## 2023-09-07 ENCOUNTER — Encounter: Payer: Self-pay | Admitting: Women's Health

## 2023-09-07 LAB — CERVICOVAGINAL ANCILLARY ONLY
Bacterial Vaginitis (gardnerella): NEGATIVE
Candida Glabrata: NEGATIVE
Candida Vaginitis: NEGATIVE
Chlamydia: NEGATIVE
Comment: NEGATIVE
Comment: NEGATIVE
Comment: NEGATIVE
Comment: NEGATIVE
Comment: NEGATIVE
Comment: NORMAL
Neisseria Gonorrhea: NEGATIVE
Trichomonas: NEGATIVE

## 2023-09-19 ENCOUNTER — Encounter: Payer: Self-pay | Admitting: Women's Health

## 2023-09-19 ENCOUNTER — Ambulatory Visit: Admitting: Radiology

## 2023-09-19 ENCOUNTER — Ambulatory Visit: Admitting: Women's Health

## 2023-09-19 VITALS — BP 144/81 | HR 108 | Wt 301.0 lb

## 2023-09-19 DIAGNOSIS — Z3A31 31 weeks gestation of pregnancy: Secondary | ICD-10-CM

## 2023-09-19 DIAGNOSIS — O10913 Unspecified pre-existing hypertension complicating pregnancy, third trimester: Secondary | ICD-10-CM

## 2023-09-19 DIAGNOSIS — Z6841 Body Mass Index (BMI) 40.0 and over, adult: Secondary | ICD-10-CM

## 2023-09-19 DIAGNOSIS — O10919 Unspecified pre-existing hypertension complicating pregnancy, unspecified trimester: Secondary | ICD-10-CM

## 2023-09-19 DIAGNOSIS — O0993 Supervision of high risk pregnancy, unspecified, third trimester: Secondary | ICD-10-CM | POA: Diagnosis not present

## 2023-09-19 LAB — POCT URINALYSIS DIPSTICK OB
Blood, UA: NEGATIVE
Glucose, UA: NEGATIVE
Ketones, UA: NEGATIVE
Leukocytes, UA: NEGATIVE
Nitrite, UA: NEGATIVE

## 2023-09-19 NOTE — Patient Instructions (Signed)
 Kerry Johns, thank you for choosing our office today! We appreciate the opportunity to meet your healthcare needs. You may receive a short survey by mail, e-mail, or through [employer]. If you are happy with your care we would appreciate if you could take just a few minutes to complete the survey questions. We read all of your comments and take your feedback very seriously. Thank you again for choosing our office.  Center for [employer] Team at [facility]  [facility] at [facility] ([address] [place], [place] [address]) Entrance C, located off of [address] Free 24/7 valet parking   CLASSES: Go to [email or web address] to register for classes (childbirth, breastfeeding, waterbirth, infant CPR, daddy bootcamp, etc.)  Call the office [phone]) or go to [facility] if: You begin to have strong, frequent contractions Your water breaks.  Sometimes it is a big gush of fluid, sometimes it is just a trickle that keeps getting your panties wet or running down your legs You have vaginal bleeding.  It is normal to have a small amount of spotting if your cervix was checked.  You don't feel your baby moving like normal.  If you don't, get you something to eat and drink and lay down and focus on feeling your baby move.   If your baby is still not moving like normal, you should call the office or go to [facility].  Call the office [phone]) or go to [facility] hospital for these signs of pre-eclampsia: Severe headache that does not go away with Tylenol Visual changes- seeing spots, double, blurred vision Pain under your right breast or upper abdomen that does not go away with Tums or heartburn medicine Nausea and/or vomiting Severe swelling in your hands, feet, and face   Tdap Vaccine It is recommended that you get the Tdap vaccine during the third trimester of EACH pregnancy to help protect your baby from getting pertussis (whooping cough) 27-36 weeks is the BEST time to do  this so that you can pass the protection on to your baby. During pregnancy is better than after pregnancy, but if you are unable to get it during pregnancy it will be offered at the hospital.  You can get this vaccine with us , at the health department, your family doctor, or some local pharmacies Everyone who will be around your baby should also be up-to-date on their vaccines before the baby comes. Adults (who are not pregnant) only need 1 dose of Tdap during adulthood.   [facility] Pediatricians/Family Doctors [facility]): [address] Meg Spina, [phone]           [facility]: [address], [phone]                [facility] Medicine [facility]): [address], [phone] (call to ask if accepting patients) [facility] Department: [address], [place], [phone]    [facility] Pediatricians/Family Doctors [facility]): [phone] [address], [phone] [facility]: [address] [place], [phone] [facility]: [address], [phone]  [facility] Doctors  [facility]): [phone] [facility]: [address] [phone]   [facility] Doctors [facility]: [address] [phone]  [facility] Family Doctors  [facility]: [phone], [phone]  Home Blood Pressure Monitoring for Patients   Your provider has recommended that you check your  blood pressure (BP) at least once a week at home. If you do not have a blood pressure cuff at home, one will be provided for you. Contact your provider if you have not received your monitor within 1 week.   Helpful Tips for Accurate Home Blood Pressure Checks  Don't smoke, exercise, or drink caffeine 30 minutes before checking your BP Use the restroom before checking your BP (a full bladder can raise your  pressure) Relax in a comfortable upright chair Feet on the ground Left arm resting comfortably on a flat surface at the level of your heart Legs uncrossed Back supported Sit quietly and don't talk Place the cuff on your bare arm Adjust snuggly, so that only two fingertips can fit between your skin and the top of the cuff Check 2 readings separated by at least one minute Keep a log of your BP readings For a visual, please reference this diagram: http://ccnc.care/bpdiagram  Provider Name: [facility]     Phone: [phone]  Zone 1: ALL CLEAR  Continue to monitor your symptoms:  BP reading is less than 140 (top number) or less than 90 (bottom number)  No right upper stomach pain No headaches or seeing spots No feeling nauseated or throwing up No swelling in face and hands  Zone 2: CAUTION Call your doctor's office for any of the following:  BP reading is greater than 140 (top number) or greater than 90 (bottom number)  Stomach pain under your ribs in the middle or right side Headaches or seeing spots Feeling nauseated or throwing up Swelling in face and hands  Zone 3: EMERGENCY  Seek immediate medical care if you have any of the following:  BP reading is greater than160 (top number) or greater than 110 (bottom number) Severe headaches not improving with Tylenol Serious difficulty catching your breath Any worsening symptoms from Zone 2  Preterm Labor and Birth Information  The normal length of a pregnancy is 39-41 weeks. Preterm labor is when labor starts before 37 completed weeks of pregnancy. What are the risk factors for preterm labor? Preterm labor is more likely to occur in women who: Have certain infections during pregnancy such as a bladder infection, sexually transmitted infection, or infection inside the uterus (chorioamnionitis). Have a shorter-than-normal cervix. Have gone into preterm labor before. Have had surgery on their cervix. Are younger than age 32  or older than age 6. Are African American. Are pregnant with twins or multiple babies (multiple gestation). Take street drugs or smoke while pregnant. Do not gain enough weight while pregnant. Became pregnant shortly after having been pregnant. What are the symptoms of preterm labor? Symptoms of preterm labor include: Cramps similar to those that can happen during a menstrual period. The cramps may happen with diarrhea. Pain in the abdomen or lower back. Regular uterine contractions that may feel like tightening of the abdomen. A feeling of increased pressure in the pelvis. Increased watery or bloody mucus discharge from the vagina. Water breaking (ruptured amniotic sac). Why is it important to recognize signs of preterm labor? It is important to recognize signs of preterm labor because babies who are born prematurely may not be fully developed. This can put them at an increased risk for: Long-term (chronic) heart and lung problems. Difficulty immediately after birth with regulating body systems, including blood sugar, body temperature, heart rate, and breathing rate. Bleeding in the brain. Cerebral palsy. Learning difficulties. Death. These risks are highest for babies who are born before 34 weeks  of pregnancy. How is preterm labor treated? Treatment depends on the length of your pregnancy, your condition, and the health of your baby. It may involve: Having a stitch (suture) placed in your cervix to prevent your cervix from opening too early (cerclage). Taking or being given medicines, such as: Hormone medicines. These may be given early in pregnancy to help support the pregnancy. Medicine to stop contractions. Medicines to help mature the baby's lungs. These may be prescribed if the risk of delivery is high. Medicines to prevent your baby from developing cerebral palsy. If the labor happens before 34 weeks of pregnancy, you may need to stay in the hospital. What should I do if I  think I am in preterm labor? If you think that you are going into preterm labor, call your health care provider right away. How can I prevent preterm labor in future pregnancies? To increase your chance of having a full-term pregnancy: Do not use any tobacco products, such as cigarettes, chewing tobacco, and e-cigarettes. If you need help quitting, ask your health care provider. Do not use street drugs or medicines that have not been prescribed to you during your pregnancy. Talk with your health care provider before taking any herbal supplements, even if you have been taking them regularly. Make sure you gain a healthy amount of weight during your pregnancy. Watch for infection. If you think that you might have an infection, get it checked right away. Make sure to tell your health care provider if you have gone into preterm labor before. This information is not intended to replace advice given to you by your health care provider. Make sure you discuss any questions you have with your health care provider. Document Revised: 08/25/2018 Document Reviewed: 09/24/2015 Elsevier Patient Education  2020 [employer].

## 2023-09-19 NOTE — Progress Notes (Signed)
 US : GA = 31+1 weeks Single active female fetus, cephalic, FHR = 127bpm, AFI = 16.1 cm, 23%, MVP = 4.4 cm EFW 93.8%, AC 91.8% 2144g

## 2023-09-19 NOTE — Progress Notes (Signed)
 HIGH-RISK PREGNANCY VISIT Patient name: Kerry Johns MRN [id number]  Date of birth: Dec 06, 2005 Chief Complaint:   Routine Prenatal Visit  History of Present Illness:   Kerry Johns is a 18 y.o. G22P1001 female at [redacted]w[redacted]d with an Estimated Date of Delivery: 11/20/23 being seen today for ongoing management of a high-risk pregnancy complicated by chronic hypertension currently on no meds and PG BMI 45.    Today she reports  thinks bp is up today d/t stressed this am (sick child at home) . Denies ha, visual changes, ruq/epigastric pain, n/v.  Checked bp 2-3d ago and was 127/<90.  Contractions: Not present. Vag. Bleeding: None.  Movement: Present. denies leaking of fluid.      05/09/2023   11:17 AM 05/19/2022   10:35 AM  Depression screen PHQ 2/9  Decreased Interest 1 1  Down, Depressed, Hopeless 1 0  PHQ - 2 Score 2 1  Altered sleeping 1 1  Tired, decreased energy 1 1  Change in appetite 0 1  Feeling bad or failure about yourself  1 0  Trouble concentrating 1 0  Moving slowly or fidgety/restless 0 0  Suicidal thoughts 0 0  PHQ-9 Score 6 4        05/09/2023   11:17 AM 05/19/2022   10:35 AM  GAD 7 : Generalized Anxiety Score  Nervous, Anxious, on Edge 1 1  Control/stop worrying 1 1  Worry too much - different things 1 1  Trouble relaxing 0 0  Restless 0 1  Easily annoyed or irritable 0 1  Afraid - awful might happen 2 1  Total GAD 7 Score 5 6     Review of Systems:   Pertinent items are noted in HPI Denies abnormal vaginal discharge w/ itching/odor/irritation, headaches, visual changes, shortness of breath, chest pain, abdominal pain, severe nausea/vomiting, or problems with urination or bowel movements unless otherwise stated above. Pertinent History Reviewed:  Reviewed past medical,surgical, social, obstetrical and family history.  Reviewed problem list, medications and allergies. Physical Assessment:   Vitals:   09/19/23 1117 09/19/23 1118  BP: 139/89 (!) 144/81   Pulse: (!) 108   Weight: (!) 301 lb (136.5 kg)   Body mass index is 46.45 kg/m.           Physical Examination:   General appearance: alert, well appearing, and in no distress  Mental status: alert, oriented to person, place, and time  Skin: warm & dry   Extremities: Edema: Trace    Cardiovascular: normal heart rate noted  Respiratory: normal respiratory effort, no distress  Abdomen: gravid, soft, non-tender  Pelvic: Cervical exam deferred         Fetal Status:     Movement: Present    Fetal Surveillance Testing today: US : GA = 31+1 weeks Single active female fetus, cephalic, FHR = 127bpm, AFI = 14.7 cm, 23%, MVP = 4.4 cm EFW 93.8%, AC 91.8% 2144g,  cardiac screen completed, no structural defects seen  Chaperone: N/A  Results for orders placed or performed in visit on 09/19/23 (from the past 24 hours)  POC Urinalysis Dipstick OB   Collection Time: 09/19/23 11:21 AM  Result Value Ref Range   Color, UA     Clarity, UA     Glucose, UA Negative Negative   Bilirubin, UA     Ketones, UA neg    Spec Grav, UA     Blood, UA neg    pH, UA     POC,PROTEIN,UA Trace Negative, Trace,  Small (1+), Moderate (2+), Large (3+), 4+   Urobilinogen, UA     Nitrite, UA neg    Leukocytes, UA Negative Negative   Appearance     Odor      Assessment & Plan:  High-risk pregnancy: G2P1001 at [redacted]w[redacted]d with an Estimated Date of Delivery: 11/20/23   1) CHTN, no meds, rx'd labetalol  earlier in pregnancy (stopped d/t HA and sores on head), reports she is stressed this am d/t sick child at home, home bp 2-3d ago wnl. Asymptomatic, tr proteinuria. Will check pre-e labs and f/u Wed for bp check w/ nurse, if still elevated will try nifedipine. Continue ASA, check home bp's daily, let us  know if >140/90 or pre-e s/s  2) PGBMI 45, currently 46  3) Suspected LGA> EFW today 94% w/ normal AFI (normal GTT)  Meds: No orders of the defined types were placed in this encounter.   Labs/procedures today: U/S and  pre-e labs  Treatment Plan:   depends on bp check Wed  Reviewed: Preterm labor symptoms and general obstetric precautions including but not limited to vaginal bleeding, contractions, leaking of fluid and fetal movement were reviewed in detail with the patient.  All questions were answered. Does have home bp cuff. Office bp cuff given: not applicable. Check bp daily, let us  know if consistently >140 and/or >90.  Follow-up: Return for Wed bp check w/ nurse in person.   Future Appointments  Date Time Provider Department Center  09/19/2023 11:50 AM Ferd Householder, CNM [facility]  09/21/2023  2:30 PM [facility] NURSE [facility]  10/17/2023  1:30 PM [facility] 2 [facility] None  10/17/2023  2:30 PM Ferd Householder, CNM [facility]  10/20/2023  2:10 PM [facility] NURSE [facility]  10/24/2023  1:30 PM [facility] US  [facility] None  10/24/2023  2:30 PM Ferd Householder, CNM [facility]  10/27/2023  2:30 PM [facility] NURSE [facility]  10/31/2023  2:15 PM [facility] 2 [facility] None  10/31/2023  3:10 PM Ferd Householder, CNM [facility]  11/03/2023 10:10 AM [facility] NURSE [facility]  11/07/2023  1:30 PM [facility] US  [facility] None  11/07/2023  2:30 PM Keene Pastures, DO [facility]  11/10/2023  2:10 PM [facility] NURSE [facility]  11/14/2023 10:45 AM [facility] US  [facility] None  11/14/2023 11:30 AM Ferd Householder, CNM [facility]  11/14/2023 11:50 AM Wendelyn Halter, MD [facility]  11/17/2023  2:10 PM [facility] NURSE [facility]    Orders Placed This Encounter  Procedures   CBC   Comprehensive metabolic panel with GFR   Protein / creatinine ratio, urine   POC Urinalysis Dipstick OB   Ferd Householder CNM, [facility] 09/19/2023 11:44 AM

## 2023-09-20 ENCOUNTER — Encounter: Payer: Self-pay | Admitting: Women's Health

## 2023-09-20 LAB — COMPREHENSIVE METABOLIC PANEL WITH GFR
ALT: 13 IU/L (ref 0–32)
AST: 12 IU/L (ref 0–40)
Albumin: 3.7 g/dL — ABNORMAL LOW (ref 4.0–5.0)
Alkaline Phosphatase: 126 IU/L — ABNORMAL HIGH (ref 42–106)
BUN/Creatinine Ratio: 8 — ABNORMAL LOW (ref 9–23)
BUN: 4 mg/dL — ABNORMAL LOW (ref 6–20)
Bilirubin Total: 0.4 mg/dL (ref 0.0–1.2)
CO2: 18 mmol/L — ABNORMAL LOW (ref 20–29)
Calcium: 9.2 mg/dL (ref 8.7–10.2)
Chloride: 105 mmol/L (ref 96–106)
Creatinine, Ser: 0.49 mg/dL — ABNORMAL LOW (ref 0.57–1.00)
Globulin, Total: 2.4 g/dL (ref 1.5–4.5)
Glucose: 68 mg/dL — ABNORMAL LOW (ref 70–99)
Potassium: 4.6 mmol/L (ref 3.5–5.2)
Sodium: 138 mmol/L (ref 134–144)
Total Protein: 6.1 g/dL (ref 6.0–8.5)
eGFR: 139 mL/min/{1.73_m2} (ref >59)

## 2023-09-20 LAB — CBC
Hematocrit: 33.5 % — ABNORMAL LOW (ref 34.0–46.6)
Hemoglobin: 11.2 g/dL (ref 11.1–15.9)
MCH: 30.3 pg (ref 26.6–33.0)
MCHC: 33.4 g/dL (ref 31.5–35.7)
MCV: 91 fL (ref 79–97)
Platelets: 178 10*3/uL (ref 150–450)
RBC: 3.7 x10E6/uL — ABNORMAL LOW (ref 3.77–5.28)
RDW: 13 % (ref 11.7–15.4)
WBC: 7.3 10*3/uL (ref 3.4–10.8)

## 2023-09-20 LAB — PROTEIN / CREATININE RATIO, URINE
Creatinine, Urine: 67 mg/dL
Protein, Ur: 11.4 mg/dL
Protein/Creat Ratio: 170 mg/g{creat} (ref 0–200)

## 2023-09-21 ENCOUNTER — Ambulatory Visit (INDEPENDENT_AMBULATORY_CARE_PROVIDER_SITE_OTHER): Admitting: *Deleted

## 2023-09-21 VITALS — BP 139/82 | HR 120 | Wt 301.0 lb

## 2023-09-21 DIAGNOSIS — O10913 Unspecified pre-existing hypertension complicating pregnancy, third trimester: Secondary | ICD-10-CM

## 2023-09-21 DIAGNOSIS — Z3A31 31 weeks gestation of pregnancy: Secondary | ICD-10-CM

## 2023-09-21 DIAGNOSIS — O10919 Unspecified pre-existing hypertension complicating pregnancy, unspecified trimester: Secondary | ICD-10-CM

## 2023-09-21 LAB — POCT URINALYSIS DIPSTICK OB
Blood, UA: NEGATIVE
Glucose, UA: NEGATIVE
Ketones, UA: NEGATIVE
Leukocytes, UA: NEGATIVE
Nitrite, UA: NEGATIVE
POC,PROTEIN,UA: NEGATIVE

## 2023-09-21 NOTE — Progress Notes (Signed)
   NURSE VISIT- BLOOD PRESSURE CHECK  SUBJECTIVE:  Kerry Johns is a 18 y.o. G99P1001 female here for BP check. She is [redacted]w[redacted]d pregnant    HYPERTENSION ROS:  Pregnant/postpartum:  Severe headaches that don't go away with tylenol/other medicines: No  Visual changes (seeing spots/double/blurred vision) No  Severe pain under right breast breast or in center of upper chest No  Severe nausea/vomiting No  Taking medicines as instructed not applicable    OBJECTIVE:  BP 139/82   Pulse (!) 120   Wt (!) 301 lb (136.5 kg)   LMP 02/04/2023   BMI 46.45 kg/m   Appearance alert, well appearing, and in no distress.  ASSESSMENT: Pregnancy [redacted]w[redacted]d  blood pressure check  PLAN: Discussed with Kerry Johns, CNM, [facility]   Recommendations:  Check BP twice a day and send readings in mychart message on Monday. Schedule HROB on 5/19    Follow-up:  10/03/23 for HROB    Kerry Johns  09/21/2023 2:32 PM

## 2023-09-22 ENCOUNTER — Encounter: Payer: Self-pay | Admitting: Women's Health

## 2023-09-26 ENCOUNTER — Other Ambulatory Visit: Payer: Self-pay | Admitting: Women's Health

## 2023-09-26 DIAGNOSIS — O10919 Unspecified pre-existing hypertension complicating pregnancy, unspecified trimester: Secondary | ICD-10-CM

## 2023-09-26 MED ORDER — NIFEDIPINE ER OSMOTIC RELEASE 30 MG PO TB24: 30.0000 mg | ORAL_TABLET | Freq: Every day | ORAL | 3 refills | Status: AC

## 2023-10-03 ENCOUNTER — Encounter: Admitting: Obstetrics & Gynecology

## 2023-10-04 ENCOUNTER — Other Ambulatory Visit: Payer: Self-pay | Admitting: Women's Health

## 2023-10-04 ENCOUNTER — Ambulatory Visit: Admitting: *Deleted

## 2023-10-04 DIAGNOSIS — O0993 Supervision of high risk pregnancy, unspecified, third trimester: Secondary | ICD-10-CM

## 2023-10-04 DIAGNOSIS — Z3A33 33 weeks gestation of pregnancy: Secondary | ICD-10-CM

## 2023-10-04 DIAGNOSIS — O10913 Unspecified pre-existing hypertension complicating pregnancy, third trimester: Secondary | ICD-10-CM | POA: Diagnosis not present

## 2023-10-04 DIAGNOSIS — O10919 Unspecified pre-existing hypertension complicating pregnancy, unspecified trimester: Secondary | ICD-10-CM

## 2023-10-04 DIAGNOSIS — O99213 Obesity complicating pregnancy, third trimester: Secondary | ICD-10-CM

## 2023-10-04 DIAGNOSIS — Z6841 Body Mass Index (BMI) 40.0 and over, adult: Secondary | ICD-10-CM

## 2023-10-04 NOTE — Progress Notes (Signed)
   NURSE VISIT- NST  SUBJECTIVE:  Kerry Johns is a 18 y.o. G1P1001 female at [redacted]w[redacted]d, here for a NST for pregnancy complicated by [facility] and Morbid obesity (BMI >=40).  She reports active fetal movement, contractions: none, vaginal bleeding: none, membranes: intact.   OBJECTIVE:  BP 122/72   Pulse 94   LMP 02/04/2023   Appears well, no apparent distress  No results found for this or any previous visit (from the past 24 hours).  NST: FHR baseline 135 bpm, Variability: minimal/moderate, Accelerations:present, Decelerations:  Absent= Cat 1/reactive Toco: none   ASSESSMENT: G2P1001 at [redacted]w[redacted]d with CHTN and Morbid obesity (BMI >=40) NST reactive  PLAN: EFM strip reviewed by Dr. Randolm Butte   Recommendations: keep next appointment as scheduled    [name]  10/04/2023 11:38 AM

## 2023-10-07 ENCOUNTER — Ambulatory Visit

## 2023-10-07 ENCOUNTER — Ambulatory Visit: Admitting: Obstetrics and Gynecology

## 2023-10-07 ENCOUNTER — Encounter: Payer: Self-pay | Admitting: Obstetrics and Gynecology

## 2023-10-07 VITALS — BP 123/83 | HR 112 | Wt 302.5 lb

## 2023-10-07 DIAGNOSIS — Z3A33 33 weeks gestation of pregnancy: Secondary | ICD-10-CM

## 2023-10-07 DIAGNOSIS — Z6841 Body Mass Index (BMI) 40.0 and over, adult: Secondary | ICD-10-CM | POA: Diagnosis not present

## 2023-10-07 DIAGNOSIS — O0993 Supervision of high risk pregnancy, unspecified, third trimester: Secondary | ICD-10-CM | POA: Diagnosis not present

## 2023-10-07 DIAGNOSIS — O10919 Unspecified pre-existing hypertension complicating pregnancy, unspecified trimester: Secondary | ICD-10-CM

## 2023-10-07 DIAGNOSIS — O10913 Unspecified pre-existing hypertension complicating pregnancy, third trimester: Secondary | ICD-10-CM

## 2023-10-07 DIAGNOSIS — O09891 Supervision of other high risk pregnancies, first trimester: Secondary | ICD-10-CM

## 2023-10-07 LAB — POCT URINALYSIS DIPSTICK OB
Glucose, UA: NEGATIVE
Ketones, UA: NEGATIVE
Leukocytes, UA: NEGATIVE
Nitrite, UA: NEGATIVE

## 2023-10-07 NOTE — Progress Notes (Signed)
   PRENATAL VISIT NOTE  Subjective:  Kerry Johns is a 18 y.o. G2P1001 at [redacted]w[redacted]d being seen today for ongoing prenatal care.  She is currently monitored for the following issues for this high-risk pregnancy and has Rh negative state in antepartum period; Obesity affecting pregnancy; Chronic hypertension during pregnancy, antepartum; Supervision of high-risk pregnancy; and Short interval between pregnancies affecting pregnancy in first trimester, antepartum on their problem list.  Patient reports no complaints.  Contractions: Irritability. Vag. Bleeding: None.  Movement: Present. Denies leaking of fluid.   The following portions of the patient's history were reviewed and updated as appropriate: allergies, current medications, past family history, past medical history, past social history, past surgical history and problem list.   Objective:    Vitals:   10/07/23 1120  BP: 123/83  Pulse: (!) 112  Weight: (!) 302 lb 8 oz (137.2 kg)    Fetal Status:      Movement: Present    General: Alert, oriented and cooperative. Patient is in no acute distress.  Skin: Skin is warm and dry. No rash noted.   Cardiovascular: Normal heart rate noted  Respiratory: Normal respiratory effort, no problems with respiration noted  Abdomen: Soft, gravid, appropriate for gestational age.  Pain/Pressure: Present     Pelvic: Cervical exam deferred        Extremities: Normal range of motion.  Edema: Trace  Mental Status: Normal mood and affect. Normal behavior. Normal judgment and thought content.   Assessment and Plan:  Pregnancy: G2P1001 at [redacted]w[redacted]d 1. Supervision of high risk pregnancy in third trimester (Primary) BP and FHR normal Doing well, feeling regular movement   - POC Urinalysis Dipstick OB  2. [redacted] weeks gestation of pregnancy  - POC Urinalysis Dipstick OB  3. Chronic hypertension during pregnancy, antepartum Procardia  30 mg daily  No pec s&s Continue ASA Continue u/s and antenatal testing    4. BMI 45.0-49.9, adult (HCC)    Preterm labor symptoms and general obstetric precautions including but not limited to vaginal bleeding, contractions, leaking of fluid and fetal movement were reviewed in detail with the patient. Please refer to After Visit Summary for other counseling recommendations.    Future Appointments  Date Time Provider Department Center  10/07/2023 11:30 AM Zelma Hidden, FNP [facility]  10/12/2023 10:45 AM [facility] US  [facility] None  10/12/2023 11:50 AM Ferd Householder, CNM [facility]  10/18/2023 10:45 AM [facility]G 2 [facility] None  10/18/2023 11:50 AM Jolayne Natter, CNM [facility]  10/21/2023  9:50 AM [facility] NURSE [facility]  10/25/2023 10:00 AM [facility] 2 [facility] None  10/25/2023 10:50 AM Ferd Householder, CNM [facility]  10/28/2023  9:50 AM [facility] NURSE [facility]  11/01/2023 10:45 AM [facility] IMG 2 [facility] None  11/01/2023 11:30 AM Ozan, Jennifer, DO [facility]  11/04/2023 10:30 AM [facility] NURSE [facility]  11/08/2023 11:30 AM [facility] US  [facility] None  11/08/2023  1:30 PM Keene Pastures, DO [facility]  11/11/2023 10:10 AM [facility] NURSE [facility]  11/15/2023 10:45 AM [facility] US  [facility] None  11/15/2023 11:30 AM Keene Pastures, DO [facility]    Susi Eric, FNP

## 2023-10-07 NOTE — Progress Notes (Signed)
 US  33+5 wks,cephalic,BPP 8/8,FHR 159 bpm,posterior placenta gr 1,RI .57,.56,.63=57%,AFI 16 cm

## 2023-10-11 ENCOUNTER — Other Ambulatory Visit: Payer: Self-pay | Admitting: Women's Health

## 2023-10-11 ENCOUNTER — Other Ambulatory Visit

## 2023-10-11 DIAGNOSIS — O0993 Supervision of high risk pregnancy, unspecified, third trimester: Secondary | ICD-10-CM

## 2023-10-11 DIAGNOSIS — O10919 Unspecified pre-existing hypertension complicating pregnancy, unspecified trimester: Secondary | ICD-10-CM

## 2023-10-11 DIAGNOSIS — Z6841 Body Mass Index (BMI) 40.0 and over, adult: Secondary | ICD-10-CM

## 2023-10-12 ENCOUNTER — Ambulatory Visit (INDEPENDENT_AMBULATORY_CARE_PROVIDER_SITE_OTHER)

## 2023-10-12 ENCOUNTER — Ambulatory Visit: Admitting: Women's Health

## 2023-10-12 ENCOUNTER — Encounter: Payer: Self-pay | Admitting: Women's Health

## 2023-10-12 VITALS — BP 128/83 | HR 82 | Wt 302.6 lb

## 2023-10-12 DIAGNOSIS — Z6841 Body Mass Index (BMI) 40.0 and over, adult: Secondary | ICD-10-CM

## 2023-10-12 DIAGNOSIS — Z3A34 34 weeks gestation of pregnancy: Secondary | ICD-10-CM

## 2023-10-12 DIAGNOSIS — O10913 Unspecified pre-existing hypertension complicating pregnancy, third trimester: Secondary | ICD-10-CM

## 2023-10-12 DIAGNOSIS — O0993 Supervision of high risk pregnancy, unspecified, third trimester: Secondary | ICD-10-CM | POA: Diagnosis not present

## 2023-10-12 DIAGNOSIS — O09891 Supervision of other high risk pregnancies, first trimester: Secondary | ICD-10-CM

## 2023-10-12 DIAGNOSIS — O10919 Unspecified pre-existing hypertension complicating pregnancy, unspecified trimester: Secondary | ICD-10-CM

## 2023-10-12 LAB — POCT URINALYSIS DIPSTICK OB
Blood, UA: NEGATIVE
Glucose, UA: NEGATIVE
Ketones, UA: NEGATIVE
Leukocytes, UA: NEGATIVE
Nitrite, UA: NEGATIVE
POC,PROTEIN,UA: NEGATIVE

## 2023-10-12 NOTE — Patient Instructions (Signed)
 [place], thank you for choosing our office today! We appreciate the opportunity to meet your healthcare needs. You may receive a short survey by mail, e-mail, or through [employer]. If you are happy with your care we would appreciate if you could take just a few minutes to complete the survey questions. We read all of your comments and take your feedback very seriously. Thank you again for choosing our office.  Center for [employer] Team at [facility]  [facility] at [facility] ([address] [place], [place] [address]) Entrance C, located off of [address] Free 24/7 valet parking   CLASSES: Go to [email or web address] to register for classes (childbirth, breastfeeding, waterbirth, infant CPR, daddy bootcamp, etc.)  Call the office [phone]) or go to [facility] if: You begin to have strong, frequent contractions Your water breaks.  Sometimes it is a big gush of fluid, sometimes it is just a trickle that keeps getting your panties wet or running down your legs You have vaginal bleeding.  It is normal to have a small amount of spotting if your cervix was checked.  You don't feel your baby moving like normal.  If you don't, get you something to eat and drink and lay down and focus on feeling your baby move.   If your baby is still not moving like normal, you should call the office or go to [facility].  Call the office [phone]) or go to [facility] hospital for these signs of pre-eclampsia: Severe headache that does not go away with Tylenol Visual changes- seeing spots, double, blurred vision Pain under your right breast or upper abdomen that does not go away with Tums or heartburn medicine Nausea and/or vomiting Severe swelling in your hands, feet, and face   Tdap Vaccine It is recommended that you get the Tdap vaccine during the third trimester of EACH pregnancy to help protect your baby from getting pertussis (whooping cough) 27-36 weeks is the BEST time to do  this so that you can pass the protection on to your baby. During pregnancy is better than after pregnancy, but if you are unable to get it during pregnancy it will be offered at the hospital.  You can get this vaccine with us , at the health department, your family doctor, or some local pharmacies Everyone who will be around your baby should also be up-to-date on their vaccines before the baby comes. Adults (who are not pregnant) only need 1 dose of Tdap during adulthood.   [facility] Pediatricians/Family Doctors [facility]): [address] Meg Spina, [phone]           [facility]: [address], [phone]                [facility]): [address], [phone] (call to ask if accepting patients) [facility] Department: [address], [place], [phone]    [facility] Pediatricians/Family Doctors [facility]): [phone] [address], [phone] [facility]: [address] [place], [phone] [facility]: [address], [phone]  [facility] Doctors  [facility]): [phone] [facility]: [address] [phone]   [facility] Doctors [facility]: [address] [phone]  [facility] Family Doctors  [facility]: [phone], [phone]  Home Blood Pressure Monitoring for Patients   Your provider has recommended that you check your  blood pressure (BP) at least once a week at home. If you do not have a blood pressure cuff at home, one will be provided for you. Contact your provider if you have not received your monitor within 1 week.   Helpful Tips for Accurate Home Blood Pressure Checks  Don't smoke, exercise, or drink caffeine 30 minutes before checking your BP Use the restroom before checking your BP (a full bladder can raise your  pressure) Relax in a comfortable upright chair Feet on the ground Left arm resting comfortably on a flat surface at the level of your heart Legs uncrossed Back supported Sit quietly and don't talk Place the cuff on your bare arm Adjust snuggly, so that only two fingertips can fit between your skin and the top of the cuff Check 2 readings separated by at least one minute Keep a log of your BP readings For a visual, please reference this diagram: http://ccnc.care/bpdiagram  Provider Name: [facility]     Phone: [phone]  Zone 1: ALL CLEAR  Continue to monitor your symptoms:  BP reading is less than 140 (top number) or less than 90 (bottom number)  No right upper stomach pain No headaches or seeing spots No feeling nauseated or throwing up No swelling in face and hands  Zone 2: CAUTION Call your doctor's office for any of the following:  BP reading is greater than 140 (top number) or greater than 90 (bottom number)  Stomach pain under your ribs in the middle or right side Headaches or seeing spots Feeling nauseated or throwing up Swelling in face and hands  Zone 3: EMERGENCY  Seek immediate medical care if you have any of the following:  BP reading is greater than160 (top number) or greater than 110 (bottom number) Severe headaches not improving with Tylenol Serious difficulty catching your breath Any worsening symptoms from Zone 2  Preterm Labor and Birth Information  The normal length of a pregnancy is 39-41 weeks. Preterm labor is when labor starts before 37 completed weeks of pregnancy. What are the risk factors for preterm labor? Preterm labor is more likely to occur in women who: Have certain infections during pregnancy such as a bladder infection, sexually transmitted infection, or infection inside the uterus (chorioamnionitis). Have a shorter-than-normal cervix. Have gone into preterm labor before. Have had surgery on their cervix. Are younger than age 32  or older than age 6. Are African American. Are pregnant with twins or multiple babies (multiple gestation). Take street drugs or smoke while pregnant. Do not gain enough weight while pregnant. Became pregnant shortly after having been pregnant. What are the symptoms of preterm labor? Symptoms of preterm labor include: Cramps similar to those that can happen during a menstrual period. The cramps may happen with diarrhea. Pain in the abdomen or lower back. Regular uterine contractions that may feel like tightening of the abdomen. A feeling of increased pressure in the pelvis. Increased watery or bloody mucus discharge from the vagina. Water breaking (ruptured amniotic sac). Why is it important to recognize signs of preterm labor? It is important to recognize signs of preterm labor because babies who are born prematurely may not be fully developed. This can put them at an increased risk for: Long-term (chronic) heart and lung problems. Difficulty immediately after birth with regulating body systems, including blood sugar, body temperature, heart rate, and breathing rate. Bleeding in the brain. Cerebral palsy. Learning difficulties. Death. These risks are highest for babies who are born before 34 weeks  of pregnancy. How is preterm labor treated? Treatment depends on the length of your pregnancy, your condition, and the health of your baby. It may involve: Having a stitch (suture) placed in your cervix to prevent your cervix from opening too early (cerclage). Taking or being given medicines, such as: Hormone medicines. These may be given early in pregnancy to help support the pregnancy. Medicine to stop contractions. Medicines to help mature the baby's lungs. These may be prescribed if the risk of delivery is high. Medicines to prevent your baby from developing cerebral palsy. If the labor happens before 34 weeks of pregnancy, you may need to stay in the hospital. What should I do if I  think I am in preterm labor? If you think that you are going into preterm labor, call your health care provider right away. How can I prevent preterm labor in future pregnancies? To increase your chance of having a full-term pregnancy: Do not use any tobacco products, such as cigarettes, chewing tobacco, and e-cigarettes. If you need help quitting, ask your health care provider. Do not use street drugs or medicines that have not been prescribed to you during your pregnancy. Talk with your health care provider before taking any herbal supplements, even if you have been taking them regularly. Make sure you gain a healthy amount of weight during your pregnancy. Watch for infection. If you think that you might have an infection, get it checked right away. Make sure to tell your health care provider if you have gone into preterm labor before. This information is not intended to replace advice given to you by your health care provider. Make sure you discuss any questions you have with your health care provider. Document Revised: 08/25/2018 Document Reviewed: 09/24/2015 Elsevier Patient Education  2020 [employer].

## 2023-10-12 NOTE — Progress Notes (Signed)
 US  34+3 wks,cephalic,BPP 8/8,FHR 137 bpm,posterior placenta gr 2,AFI 14 cm,RI .58,.66,.62=65%

## 2023-10-12 NOTE — Progress Notes (Signed)
 HIGH-RISK PREGNANCY VISIT Patient name: Kerry Johns MRN [id number]  Date of birth: August 03, 2005 Chief Complaint:   Routine Prenatal Visit  History of Present Illness:   Kerry Johns is a 18 y.o. G2P1001 female at [redacted]w[redacted]d with an Estimated Date of Delivery: 11/20/23 being seen today for ongoing management of a high-risk pregnancy complicated by chronic hypertension currently on nifedipine  30mg  daily and PG BMI 45.    Today she reports no complaints. Contractions: Not present. Vag. Bleeding: None.  Movement: Present. denies leaking of fluid.      05/09/2023   11:17 AM 05/19/2022   10:35 AM  Depression screen PHQ 2/9  Decreased Interest 1 1  Down, Depressed, Hopeless 1 0  PHQ - 2 Score 2 1  Altered sleeping 1 1  Tired, decreased energy 1 1  Change in appetite 0 1  Feeling bad or failure about yourself  1 0  Trouble concentrating 1 0  Moving slowly or fidgety/restless 0 0  Suicidal thoughts 0 0  PHQ-9 Score 6 4        05/09/2023   11:17 AM 05/19/2022   10:35 AM  GAD 7 : Generalized Anxiety Score  Nervous, Anxious, on Edge 1 1  Control/stop worrying 1 1  Worry too much - different things 1 1  Trouble relaxing 0 0  Restless 0 1  Easily annoyed or irritable 0 1  Afraid - awful might happen 2 1  Total GAD 7 Score 5 6     Review of Systems:   Pertinent items are noted in HPI Denies abnormal vaginal discharge w/ itching/odor/irritation, headaches, visual changes, shortness of breath, chest pain, abdominal pain, severe nausea/vomiting, or problems with urination or bowel movements unless otherwise stated above. Pertinent History Reviewed:  Reviewed past medical,surgical, social, obstetrical and family history.  Reviewed problem list, medications and allergies. Physical Assessment:   Vitals:   10/12/23 1129  BP: 128/83  Pulse: 82  Weight: (!) 302 lb 9.6 oz (137.3 kg)  Body mass index is 46.69 kg/m.           Physical Examination:   General appearance: alert, well  appearing, and in no distress  Mental status: alert, oriented to person, place, and time  Skin: warm & dry   Extremities:      Cardiovascular: normal heart rate noted  Respiratory: normal respiratory effort, no distress  Abdomen: gravid, soft, non-tender  Pelvic: Cervical exam deferred         Fetal Status:     Movement: Present    Fetal Surveillance Testing today: US  34+3 wks,cephalic,BPP 8/8,FHR 137 bpm,posterior placenta gr 2,AFI 14 cm,RI .58,.66,.62=65%   Chaperone: N/A  Results for orders placed or performed in visit on 10/12/23 (from the past 24 hours)  POC Urinalysis Dipstick OB   Collection Time: 10/12/23 11:28 AM  Result Value Ref Range   Color, UA     Clarity, UA     Glucose, UA Negative Negative   Bilirubin, UA     Ketones, UA neg    Spec Grav, UA     Blood, UA neg    pH, UA     POC,PROTEIN,UA Negative Negative, Trace, Small (1+), Moderate (2+), Large (3+), 4+   Urobilinogen, UA     Nitrite, UA neg    Leukocytes, UA Negative Negative   Appearance     Odor      Assessment & Plan:  High-risk pregnancy: G2P1001 at [redacted]w[redacted]d with an Estimated Date of Delivery: 11/20/23  1) CHTN, stable on nifedipine  30mg  daily, ASA  2) 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   Labs/procedures today: U/S  Treatment Plan:  EFW q 4w     2x/wk testing nst/sono     Deliver 37-39.0wks (or as per MFM w/ poor control)____   Reviewed: Preterm labor symptoms and general obstetric precautions including but not limited to vaginal bleeding, contractions, leaking of fluid and fetal movement were reviewed in detail with the patient.  All questions were answered. Does have home bp cuff. Office bp cuff given: not applicable. Check bp daily, let us  know if consistently >140 and/or >90.  Follow-up: Return for As scheduled.   Future Appointments  Date Time Provider Department Center  10/12/2023 11:50 AM  Ferd Householder, CNM [facility]  10/18/2023 10:45 AM [facility] 2 [facility] None  10/18/2023 11:50 AM Jolayne Natter, CNM [facility]  10/21/2023  9:50 AM [facility] NURSE [facility]  10/25/2023 10:00 AM [facility] 2 [facility] None  10/25/2023 10:50 AM Ferd Householder, CNM [facility]  10/28/2023  9:50 AM [facility] NURSE [facility]  11/01/2023 10:45 AM [facility] 2 [facility] None  11/01/2023 11:30 AM Ozan, Jennifer, DO [facility]  11/04/2023 10:30 AM [facility] NURSE [facility]  11/08/2023 11:30 AM [facility] US  [facility] None  11/08/2023  1:30 PM Keene Pastures, DO [facility]  11/11/2023 10:10 AM [facility] NURSE [facility]  11/15/2023 10:45 AM [facility] US  [facility] None  11/15/2023 11:30 AM Keene Pastures, DO [facility]    Orders Placed This Encounter  Procedures   POC Urinalysis Dipstick OB   Ferd Householder CNM, [facility] 10/12/2023 11:47 AM

## 2023-10-14 ENCOUNTER — Other Ambulatory Visit

## 2023-10-14 ENCOUNTER — Encounter: Admitting: Obstetrics & Gynecology

## 2023-10-17 ENCOUNTER — Other Ambulatory Visit: Admitting: Radiology

## 2023-10-17 ENCOUNTER — Encounter: Admitting: Women's Health

## 2023-10-17 ENCOUNTER — Other Ambulatory Visit: Payer: Self-pay | Admitting: Women's Health

## 2023-10-17 DIAGNOSIS — O10919 Unspecified pre-existing hypertension complicating pregnancy, unspecified trimester: Secondary | ICD-10-CM

## 2023-10-17 DIAGNOSIS — Z6841 Body Mass Index (BMI) 40.0 and over, adult: Secondary | ICD-10-CM

## 2023-10-17 DIAGNOSIS — O0993 Supervision of high risk pregnancy, unspecified, third trimester: Secondary | ICD-10-CM

## 2023-10-18 ENCOUNTER — Ambulatory Visit: Admitting: Radiology

## 2023-10-18 ENCOUNTER — Ambulatory Visit: Admitting: Advanced Practice Midwife

## 2023-10-18 ENCOUNTER — Encounter: Payer: Self-pay | Admitting: Advanced Practice Midwife

## 2023-10-18 VITALS — BP 121/81 | HR 103 | Wt 303.2 lb

## 2023-10-18 DIAGNOSIS — O10913 Unspecified pre-existing hypertension complicating pregnancy, third trimester: Secondary | ICD-10-CM | POA: Diagnosis not present

## 2023-10-18 DIAGNOSIS — Z68.41 Body mass index (BMI) pediatric, greater than or equal to 140% of the 95th percentile for age: Secondary | ICD-10-CM | POA: Diagnosis not present

## 2023-10-18 DIAGNOSIS — O360130 Maternal care for anti-D [Rh] antibodies, third trimester, not applicable or unspecified: Secondary | ICD-10-CM | POA: Diagnosis not present

## 2023-10-18 DIAGNOSIS — O0993 Supervision of high risk pregnancy, unspecified, third trimester: Secondary | ICD-10-CM

## 2023-10-18 DIAGNOSIS — Z3A35 35 weeks gestation of pregnancy: Secondary | ICD-10-CM

## 2023-10-18 DIAGNOSIS — Z6791 Unspecified blood type, Rh negative: Secondary | ICD-10-CM | POA: Diagnosis not present

## 2023-10-18 DIAGNOSIS — Z6841 Body Mass Index (BMI) 40.0 and over, adult: Secondary | ICD-10-CM

## 2023-10-18 DIAGNOSIS — O10919 Unspecified pre-existing hypertension complicating pregnancy, unspecified trimester: Secondary | ICD-10-CM

## 2023-10-18 NOTE — Progress Notes (Signed)
 HIGH-RISK PREGNANCY VISIT Patient name: Kerry Johns MRN [id number]  Date of birth: [birth year] Chief Complaint:   Routine Prenatal Visit and Pregnancy Ultrasound  History of Present Illness:   Kerry Johns is a 18 y.o. G48P1001 female at [redacted]w[redacted]d with an Estimated Date of Delivery: 11/20/23 being seen today for ongoing management of a high-risk pregnancy complicated by chronic hypertension currently on ProcardiaXL 30mg  and PG BMI 45.    Today she reports end of preg discomforts but otherwise okay. Contractions: Irregular.  .  Movement: Present. denies leaking of fluid.      05/09/2023   11:17 AM 05/19/2022   10:35 AM  Depression screen PHQ 2/9  Decreased Interest 1 1  Down, Depressed, Hopeless 1 0  PHQ - 2 Score 2 1  Altered sleeping 1 1  Tired, decreased energy 1 1  Change in appetite 0 1  Feeling bad or failure about yourself  1 0  Trouble concentrating 1 0  Moving slowly or fidgety/restless 0 0  Suicidal thoughts 0 0  PHQ-9 Score 6 4        05/09/2023   11:17 AM 05/19/2022   10:35 AM  GAD 7 : Generalized Anxiety Score  Nervous, Anxious, on Edge 1 1  Control/stop worrying 1 1  Worry too much - different things 1 1  Trouble relaxing 0 0  Restless 0 1  Easily annoyed or irritable 0 1  Afraid - awful might happen 2 1  Total GAD 7 Score 5 6     Review of Systems:   Pertinent items are noted in HPI Denies abnormal vaginal discharge w/ itching/odor/irritation, headaches, visual changes, shortness of breath, chest pain, abdominal pain, severe nausea/vomiting, or problems with urination or bowel movements unless otherwise stated above. Pertinent History Reviewed:  Reviewed past medical,surgical, social, obstetrical and family history.  Reviewed problem list, medications and allergies. Physical Assessment:   Vitals:   10/18/23 1134  BP: 121/81  Pulse: (!) 103  Weight: (!) 303 lb 3.2 oz (137.5 kg)  Body mass index is 46.79 kg/m.           Physical Examination:    General appearance: alert, well appearing, and in no distress  Mental status: alert, oriented to person, place, and time  Skin: warm & dry   Extremities: Edema: Trace    Cardiovascular: normal heart rate noted  Respiratory: normal respiratory effort, no distress  Abdomen: gravid, soft, non-tender  Pelvic: Cervical exam deferred         Fetal Status: Fetal Heart Rate (bpm): 156 u/s   Movement: Present    Fetal Surveillance Testing today: US : GA = 35+2 weeks Single active fetus, cephalic, FHR = 156 bpm, posterior pl, gr1, AFI = 9.6 cm, 12%, MVP = 4 cm, BPP = 8/8, RI: 0.60  55%    No results found for this or any previous visit (from the past 24 hours).  Assessment & Plan:  High-risk pregnancy: G2P1001 at [redacted]w[redacted]d with an Estimated Date of Delivery: 11/20/23   1) cHTN, stable on Procardia  XL 30mg  daily; will schedule IOL @ next visit  2) PG BMI 45  3) Suspected LGA, EFW 94% @ 31wk  Meds: No orders of the defined types were placed in this encounter.   Labs/procedures today: U/S  Treatment Plan:       Growth u/s q 4wks    2x/wk testing nst/sono @ 32wks     Deliver 38-39wks (37wks or prn if poor control)____   Reviewed:  Preterm labor symptoms and general obstetric precautions including but not limited to vaginal bleeding, contractions, leaking of fluid and fetal movement were reviewed in detail with the patient.  All questions were answered. Does have home bp cuff. Office bp cuff given: not applicable. Check bp daily, let us  know if consistently >140 and/or >90.  Follow-up: Return for As scheduled (add note for GBS/cultures @ next visit).   Future Appointments  Date Time Provider Department Center  10/21/2023  9:50 AM [facility] NURSE [facility]  10/25/2023 10:00 AM [facility] 2 [facility] None  10/25/2023 10:50 AM Ferd Householder, CNM [facility]  10/28/2023  9:50 AM [facility] NURSE [facility]  11/01/2023 10:45 AM [facility] 2 [facility] None  11/01/2023 11:30 AM  Ozan, Jennifer, DO [facility]  11/04/2023 10:30 AM [facility] NURSE [facility]  11/08/2023 11:30 AM [facility] US  [facility] None  11/08/2023  1:30 PM Keene Pastures, DO [facility]  11/11/2023 10:10 AM [facility] NURSE [facility]  11/15/2023 10:45 AM [facility] US  [facility] None  11/15/2023 11:30 AM Keene Pastures, DO [facility]    No orders of the defined types were placed in this encounter.  Jolayne Natter CNM 10/18/2023 12:02 PM

## 2023-10-18 NOTE — Progress Notes (Signed)
 US : GA = 35+2 weeks Single active fetus, cephalic, FHR = 156 bpm, posterior pl, gr1, AFI = 9.6 cm, 12%, MVP = 4 cm, BPP = 8/8, RI: 0.60  55%

## 2023-10-19 ENCOUNTER — Other Ambulatory Visit: Payer: Self-pay | Admitting: Advanced Practice Midwife

## 2023-10-19 ENCOUNTER — Other Ambulatory Visit: Payer: Self-pay | Admitting: Women's Health

## 2023-10-19 DIAGNOSIS — O10919 Unspecified pre-existing hypertension complicating pregnancy, unspecified trimester: Secondary | ICD-10-CM

## 2023-10-19 DIAGNOSIS — O0993 Supervision of high risk pregnancy, unspecified, third trimester: Secondary | ICD-10-CM

## 2023-10-19 DIAGNOSIS — Z6841 Body Mass Index (BMI) 40.0 and over, adult: Secondary | ICD-10-CM

## 2023-10-19 DIAGNOSIS — Z3A29 29 weeks gestation of pregnancy: Secondary | ICD-10-CM

## 2023-10-19 DIAGNOSIS — Z3A22 22 weeks gestation of pregnancy: Secondary | ICD-10-CM

## 2023-10-19 DIAGNOSIS — O26893 Other specified pregnancy related conditions, third trimester: Secondary | ICD-10-CM

## 2023-10-19 DIAGNOSIS — O0992 Supervision of high risk pregnancy, unspecified, second trimester: Secondary | ICD-10-CM

## 2023-10-19 DIAGNOSIS — N898 Other specified noninflammatory disorders of vagina: Secondary | ICD-10-CM

## 2023-10-20 ENCOUNTER — Other Ambulatory Visit

## 2023-10-21 ENCOUNTER — Ambulatory Visit

## 2023-10-21 VITALS — BP 129/82 | Wt 306.0 lb

## 2023-10-21 DIAGNOSIS — Z3A35 35 weeks gestation of pregnancy: Secondary | ICD-10-CM | POA: Diagnosis not present

## 2023-10-21 DIAGNOSIS — O1002 Pre-existing essential hypertension complicating childbirth: Secondary | ICD-10-CM | POA: Diagnosis not present

## 2023-10-21 DIAGNOSIS — O0993 Supervision of high risk pregnancy, unspecified, third trimester: Secondary | ICD-10-CM | POA: Diagnosis not present

## 2023-10-21 DIAGNOSIS — O99213 Obesity complicating pregnancy, third trimester: Secondary | ICD-10-CM | POA: Diagnosis not present

## 2023-10-21 NOTE — Progress Notes (Signed)
   NURSE VISIT- NST  SUBJECTIVE:  Kerry Johns is a 18 y.o. G32P1001 female at [redacted]w[redacted]d, here for a NST for pregnancy complicated by [facility] and Morbid obesity (BMI >=40).  She reports active fetal movement, contractions: none, vaginal bleeding: none, membranes: intact.   OBJECTIVE:  BP 129/82   Wt (!) 306 lb (138.8 kg)   LMP 02/04/2023   BMI 47.22 kg/m   Appears well, no apparent distress  No results found for this or any previous visit (from the past 24 hours).  NST: FHR baseline 145 bpm, Variability: moderate, Accelerations:present, Decelerations:  Absent= Cat 1/reactive Toco: none   ASSESSMENT: G2P1001 at [redacted]w[redacted]d with CHTN and Morbid obesity (BMI >=40) NST reactive  PLAN: EFM strip reviewed by Dr. Ozan   Recommendations: keep next appointment as scheduled    Jarah Pember E Courtenay Hirth  10/21/2023 9:56 AM

## 2023-10-24 ENCOUNTER — Encounter: Admitting: Women's Health

## 2023-10-24 ENCOUNTER — Other Ambulatory Visit

## 2023-10-25 ENCOUNTER — Encounter: Payer: Self-pay | Admitting: Women's Health

## 2023-10-25 ENCOUNTER — Ambulatory Visit: Admitting: Women's Health

## 2023-10-25 ENCOUNTER — Other Ambulatory Visit (HOSPITAL_COMMUNITY)
Admission: RE | Admit: 2023-10-25 | Discharge: 2023-10-25 | Disposition: A | Discharge status: Home / Self Care | Source: Ambulatory Visit | Attending: Women's Health | Admitting: Women's Health

## 2023-10-25 ENCOUNTER — Ambulatory Visit: Admitting: Radiology

## 2023-10-25 VITALS — BP 127/86 | HR 99 | Wt 302.6 lb

## 2023-10-25 DIAGNOSIS — O10013 Pre-existing essential hypertension complicating pregnancy, third trimester: Secondary | ICD-10-CM | POA: Diagnosis not present

## 2023-10-25 DIAGNOSIS — Z3A36 36 weeks gestation of pregnancy: Secondary | ICD-10-CM | POA: Diagnosis not present

## 2023-10-25 DIAGNOSIS — Z6841 Body Mass Index (BMI) 40.0 and over, adult: Secondary | ICD-10-CM

## 2023-10-25 DIAGNOSIS — O0993 Supervision of high risk pregnancy, unspecified, third trimester: Secondary | ICD-10-CM | POA: Insufficient documentation

## 2023-10-25 DIAGNOSIS — O10919 Unspecified pre-existing hypertension complicating pregnancy, unspecified trimester: Secondary | ICD-10-CM

## 2023-10-25 DIAGNOSIS — O10913 Unspecified pre-existing hypertension complicating pregnancy, third trimester: Secondary | ICD-10-CM

## 2023-10-25 DIAGNOSIS — Z1389 Encounter for screening for other disorder: Secondary | ICD-10-CM

## 2023-10-25 DIAGNOSIS — O0992 Supervision of high risk pregnancy, unspecified, second trimester: Secondary | ICD-10-CM

## 2023-10-25 DIAGNOSIS — Z331 Pregnant state, incidental: Secondary | ICD-10-CM

## 2023-10-25 LAB — POCT URINALYSIS DIPSTICK OB
Glucose, UA: NEGATIVE
Ketones, UA: NEGATIVE
Leukocytes, UA: NEGATIVE
Nitrite, UA: NEGATIVE

## 2023-10-25 NOTE — Patient Instructions (Signed)
 Kerry Johns, thank you for choosing our office today! We appreciate the opportunity to meet your healthcare needs. You may receive a short survey by mail, e-mail, or through [employer]. If you are happy with your care we would appreciate if you could take just a few minutes to complete the survey questions. We read all of your comments and take your feedback very seriously. Thank you again for choosing our office.  Center for [employer] Team at [facility]  [facility] at [facility] ([address] [place], [place] [address]) Entrance C, located off of [address] Free 24/7 valet parking   CLASSES: Go to [email or web address] to register for classes (childbirth, breastfeeding, waterbirth, infant CPR, daddy bootcamp, etc.)  Call the office [phone]) or go to [facility] if: You begin to have strong, frequent contractions Your water breaks.  Sometimes it is a big gush of fluid, sometimes it is just a trickle that keeps getting your panties wet or running down your legs You have vaginal bleeding.  It is normal to have a small amount of spotting if your cervix was checked.  You don't feel your baby moving like normal.  If you don't, get you something to eat and drink and lay down and focus on feeling your baby move.   If your baby is still not moving like normal, you should call the office or go to [facility].  Call the office [phone]) or go to [facility] hospital for these signs of pre-eclampsia: Severe headache that does not go away with Tylenol Visual changes- seeing spots, double, blurred vision Pain under your right breast or upper abdomen that does not go away with Tums or heartburn medicine Nausea and/or vomiting Severe swelling in your hands, feet, and face   Tdap Vaccine It is recommended that you get the Tdap vaccine during the third trimester of EACH pregnancy to help protect your baby from getting pertussis (whooping cough) 27-36 weeks is the BEST time to do  this so that you can pass the protection on to your baby. During pregnancy is better than after pregnancy, but if you are unable to get it during pregnancy it will be offered at the hospital.  You can get this vaccine with us , at the health department, your family doctor, or some local pharmacies Everyone who will be around your baby should also be up-to-date on their vaccines before the baby comes. Adults (who are not pregnant) only need 1 dose of Tdap during adulthood.   [facility] Pediatricians/Family Doctors [facility]): [address] Meg Spina, [phone]           [facility]: [address], [phone]                [facility] Medicine [facility]): [address], [phone] (call to ask if accepting patients) [facility] Department: [address], [place], [phone]    [facility] Pediatricians/Family Doctors [facility]): [phone] [address], [phone] [facility]: [address] [place], [phone] [facility]: [address], [phone]  [facility] Doctors  [facility]): [phone] [facility]: [address] [phone]   [facility] Doctors [facility]: [address] [phone]  [facility] Family Doctors  [facility]: [phone], [phone]  Home Blood Pressure Monitoring for Patients   Your provider has recommended that you check your  blood pressure (BP) at least once a week at home. If you do not have a blood pressure cuff at home, one will be provided for you. Contact your provider if you have not received your monitor within 1 week.   Helpful Tips for Accurate Home Blood Pressure Checks  Don't smoke, exercise, or drink caffeine 30 minutes before checking your BP Use the restroom before checking your BP (a full bladder can raise your  pressure) Relax in a comfortable upright chair Feet on the ground Left arm resting comfortably on a flat surface at the level of your heart Legs uncrossed Back supported Sit quietly and don't talk Place the cuff on your bare arm Adjust snuggly, so that only two fingertips can fit between your skin and the top of the cuff Check 2 readings separated by at least one minute Keep a log of your BP readings For a visual, please reference this diagram: http://ccnc.care/bpdiagram  Provider Name: [facility]     Phone: [phone]  Zone 1: ALL CLEAR  Continue to monitor your symptoms:  BP reading is less than 140 (top number) or less than 90 (bottom number)  No right upper stomach pain No headaches or seeing spots No feeling nauseated or throwing up No swelling in face and hands  Zone 2: CAUTION Call your doctor's office for any of the following:  BP reading is greater than 140 (top number) or greater than 90 (bottom number)  Stomach pain under your ribs in the middle or right side Headaches or seeing spots Feeling nauseated or throwing up Swelling in face and hands  Zone 3: EMERGENCY  Seek immediate medical care if you have any of the following:  BP reading is greater than160 (top number) or greater than 110 (bottom number) Severe headaches not improving with Tylenol Serious difficulty catching your breath Any worsening symptoms from Zone 2  Preterm Labor and Birth Information  The normal length of a pregnancy is 39-41 weeks. Preterm labor is when labor starts before 37 completed weeks of pregnancy. What are the risk factors for preterm labor? Preterm labor is more likely to occur in women who: Have certain infections during pregnancy such as a bladder infection, sexually transmitted infection, or infection inside the uterus (chorioamnionitis). Have a shorter-than-normal cervix. Have gone into preterm labor before. Have had surgery on their cervix. Are younger than age 32  or older than age 6. Are African American. Are pregnant with twins or multiple babies (multiple gestation). Take street drugs or smoke while pregnant. Do not gain enough weight while pregnant. Became pregnant shortly after having been pregnant. What are the symptoms of preterm labor? Symptoms of preterm labor include: Cramps similar to those that can happen during a menstrual period. The cramps may happen with diarrhea. Pain in the abdomen or lower back. Regular uterine contractions that may feel like tightening of the abdomen. A feeling of increased pressure in the pelvis. Increased watery or bloody mucus discharge from the vagina. Water breaking (ruptured amniotic sac). Why is it important to recognize signs of preterm labor? It is important to recognize signs of preterm labor because babies who are born prematurely may not be fully developed. This can put them at an increased risk for: Long-term (chronic) heart and lung problems. Difficulty immediately after birth with regulating body systems, including blood sugar, body temperature, heart rate, and breathing rate. Bleeding in the brain. Cerebral palsy. Learning difficulties. Death. These risks are highest for babies who are born before 34 weeks  of pregnancy. How is preterm labor treated? Treatment depends on the length of your pregnancy, your condition, and the health of your baby. It may involve: Having a stitch (suture) placed in your cervix to prevent your cervix from opening too early (cerclage). Taking or being given medicines, such as: Hormone medicines. These may be given early in pregnancy to help support the pregnancy. Medicine to stop contractions. Medicines to help mature the baby's lungs. These may be prescribed if the risk of delivery is high. Medicines to prevent your baby from developing cerebral palsy. If the labor happens before 34 weeks of pregnancy, you may need to stay in the hospital. What should I do if I  think I am in preterm labor? If you think that you are going into preterm labor, call your health care provider right away. How can I prevent preterm labor in future pregnancies? To increase your chance of having a full-term pregnancy: Do not use any tobacco products, such as cigarettes, chewing tobacco, and e-cigarettes. If you need help quitting, ask your health care provider. Do not use street drugs or medicines that have not been prescribed to you during your pregnancy. Talk with your health care provider before taking any herbal supplements, even if you have been taking them regularly. Make sure you gain a healthy amount of weight during your pregnancy. Watch for infection. If you think that you might have an infection, get it checked right away. Make sure to tell your health care provider if you have gone into preterm labor before. This information is not intended to replace advice given to you by your health care provider. Make sure you discuss any questions you have with your health care provider. Document Revised: 08/25/2018 Document Reviewed: 09/24/2015 Elsevier Patient Education  2020 [employer].

## 2023-10-25 NOTE — Progress Notes (Signed)
 HIGH-RISK PREGNANCY VISIT Patient name: Kerry Johns MRN [id number]  Date of birth: [birth year] Chief Complaint:   Routine Prenatal Visit and Pregnancy Ultrasound (culture)  History of Present Illness:   Kerry Johns is a 18 y.o. G56P1001 female at [redacted]w[redacted]d with an Estimated Date of Delivery: 11/20/23 being seen today for ongoing management of a high-risk pregnancy complicated by chronic hypertension currently on nifedipine  30mg , PGBMI 45.    Today she reports no complaints. Contractions: Irregular.  .  Movement: Present. denies leaking of fluid.      05/09/2023   11:17 AM 05/19/2022   10:35 AM  Depression screen PHQ 2/9  Decreased Interest 1 1  Down, Depressed, Hopeless 1 0  PHQ - 2 Score 2 1  Altered sleeping 1 1  Tired, decreased energy 1 1  Change in appetite 0 1  Feeling bad or failure about yourself  1 0  Trouble concentrating 1 0  Moving slowly or fidgety/restless 0 0  Suicidal thoughts 0 0  PHQ-9 Score 6 4        05/09/2023   11:17 AM 05/19/2022   10:35 AM  GAD 7 : Generalized Anxiety Score  Nervous, Anxious, on Edge 1 1  Control/stop worrying 1 1  Worry too much - different things 1 1  Trouble relaxing 0 0  Restless 0 1  Easily annoyed or irritable 0 1  Afraid - awful might happen 2 1  Total GAD 7 Score 5 6     Review of Systems:   Pertinent items are noted in HPI Denies abnormal vaginal discharge w/ itching/odor/irritation, headaches, visual changes, shortness of breath, chest pain, abdominal pain, severe nausea/vomiting, or problems with urination or bowel movements unless otherwise stated above. Pertinent History Reviewed:  Reviewed past medical,surgical, social, obstetrical and family history.  Reviewed problem list, medications and allergies. Physical Assessment:   Vitals:   10/25/23 1025  BP: 127/86  Pulse: 99  Weight: (!) 302 lb 9.6 oz (137.3 kg)  Body mass index is 46.69 kg/m.           Physical Examination:   General appearance: alert,  well appearing, and in no distress  Mental status: alert, oriented to person, place, and time  Skin: warm & dry   Extremities: Edema: Trace    Cardiovascular: normal heart rate noted  Respiratory: normal respiratory effort, no distress  Abdomen: gravid, soft, non-tender  Pelvic: Cervical exam performed  Dilation: 3 Effacement (%): 50 Station: -2  Fetal Status:     Movement: Present Presentation: Vertex  Fetal Surveillance Testing today: US : GA = 36+2 weeks Single active fetus, cephalic, FHR = 155 bpm, posterior Right placenta, AFI = 13 cm, 43%, MVP = 4 cm, EFW 77% AC 87%, 3155g, BPP = 8/8, RI: 0.56, 0.48   27%  nl ov's  Chaperone: Peggy Dones  Results for orders placed or performed in visit on 10/25/23 (from the past 24 hours)  POC Urinalysis Dipstick OB   Collection Time: 10/25/23 10:47 AM  Result Value Ref Range   Color, UA     Clarity, UA     Glucose, UA Negative Negative   Bilirubin, UA     Ketones, UA negative    Spec Grav, UA     Blood, UA trace    pH, UA     POC,PROTEIN,UA Trace Negative, Trace, Small (1+), Moderate (2+), Large (3+), 4+   Urobilinogen, UA     Nitrite, UA negative    Leukocytes, UA Negative Negative  Appearance     Odor      Assessment & Plan:  High-risk pregnancy: G2P1001 at [redacted]w[redacted]d with an Estimated Date of Delivery: 11/20/23   1) CHTN, stable on nifedipine  30mg  daily, ASA, reviewed pre-e s/s, reasons to seek care. IOL scheduled for 6/15 AM,  IOL form faxed and orders placed   2) PGBMI 45, currently 46  Meds: No orders of the defined types were placed in this encounter.   Labs/procedures today: U/S  Treatment Plan: 2x/wk testing nst/sono    Deliver 37-39.0wks (or as per MFM w/ poor control)____  Pt wants 37wks  Reviewed: Preterm labor symptoms and general obstetric precautions including but not limited to vaginal bleeding, contractions, leaking of fluid and fetal movement were reviewed in detail with the patient.  All questions were answered.  Does have home bp cuff. Office bp cuff given: not applicable. Check bp daily, let us  know if consistently >140 and/or >90.  Follow-up: Return for As scheduled.   Future Appointments  Date Time Provider Department Center  10/28/2023  9:50 AM [facility] NURSE [facility]    Orders Placed This Encounter  Procedures   Culture, beta strep (group b only)   POC Urinalysis Dipstick OB   Ferd Householder CNM, [facility] 10/25/2023 11:08 AM

## 2023-10-25 NOTE — Progress Notes (Signed)
 US : GA = 36+2 weeks Single active fetus, cephalic, FHR = 155 bpm, posterior Right placenta, AFI = 13 cm, 43%, MVP = 4 cm, EFW 77% AC 87%, 3155g, BPP = 8/8, RI: 0.56, 0.48   27%  nl ov's

## 2023-10-26 ENCOUNTER — Telehealth: Payer: Self-pay | Admitting: *Deleted

## 2023-10-26 LAB — CERVICOVAGINAL ANCILLARY ONLY
Chlamydia: NEGATIVE
Comment: NEGATIVE
Comment: NORMAL
Neisseria Gonorrhea: NEGATIVE

## 2023-10-26 NOTE — Telephone Encounter (Signed)
 Patient called stating she has been having weird contractions on/off all day with some being stronger than others. States she will have several contractions then will go a little while and not have any then they will come back. Denies leaking or bleeding.  Informed patient it sounded like false labor contractions as they did not sound consistent in strength or frequency.  Advised to lay down and get in extra water.  If contractions became more consistent,painful or she developed any bleeding or leaking, then to [facility]  Pt verbalized understanding with no further questions.

## 2023-10-27 ENCOUNTER — Other Ambulatory Visit

## 2023-10-27 ENCOUNTER — Telehealth (HOSPITAL_COMMUNITY): Payer: Self-pay | Admitting: *Deleted

## 2023-10-27 ENCOUNTER — Encounter (HOSPITAL_COMMUNITY): Payer: Self-pay | Admitting: *Deleted

## 2023-10-27 NOTE — Telephone Encounter (Signed)
 Preadmission screen

## 2023-10-28 ENCOUNTER — Ambulatory Visit: Admitting: *Deleted

## 2023-10-28 VITALS — BP 118/77 | HR 101

## 2023-10-28 DIAGNOSIS — O0993 Supervision of high risk pregnancy, unspecified, third trimester: Secondary | ICD-10-CM

## 2023-10-28 DIAGNOSIS — O10913 Unspecified pre-existing hypertension complicating pregnancy, third trimester: Secondary | ICD-10-CM

## 2023-10-28 DIAGNOSIS — Z3A36 36 weeks gestation of pregnancy: Secondary | ICD-10-CM | POA: Diagnosis not present

## 2023-10-28 DIAGNOSIS — O10919 Unspecified pre-existing hypertension complicating pregnancy, unspecified trimester: Secondary | ICD-10-CM

## 2023-10-28 NOTE — Progress Notes (Signed)
   NURSE VISIT- NST  SUBJECTIVE:  Kerry Johns is a 18 y.o. G50P1001 female at [redacted]w[redacted]d, here for a NST for pregnancy complicated by [facility].  She reports active fetal movement, contractions: none, vaginal bleeding: none, membranes: intact.   OBJECTIVE:  BP 118/77   Pulse (!) 101   LMP 02/04/2023   Appears well, no apparent distress  No results found for this or any previous visit (from the past 24 hours).  NST: FHR baseline 140 bpm, Variability: moderate, Accelerations:present, Decelerations:  Absent= Cat 1/reactive Toco: none   ASSESSMENT: G2P1001 at [redacted]w[redacted]d with CHTN NST reactive  PLAN: EFM strip reviewed by Dr. Ozan   Recommendations: keep next appointment as scheduled    Kerry Johns  10/28/2023 11:17 AM

## 2023-10-29 LAB — CULTURE, BETA STREP (GROUP B ONLY): Strep Gp B Culture: NEGATIVE

## 2023-10-30 ENCOUNTER — Inpatient Hospital Stay (HOSPITAL_COMMUNITY)
Admission: RE | Admit: 2023-10-30 | Discharge: 2023-10-31 | DRG: 807 | Disposition: A | Discharge status: Home / Self Care | Attending: Obstetrics & Gynecology | Admitting: Obstetrics & Gynecology

## 2023-10-30 ENCOUNTER — Inpatient Hospital Stay (HOSPITAL_COMMUNITY)

## 2023-10-30 ENCOUNTER — Encounter (HOSPITAL_COMMUNITY): Payer: Self-pay | Admitting: Obstetrics & Gynecology

## 2023-10-30 ENCOUNTER — Inpatient Hospital Stay (HOSPITAL_COMMUNITY): Admitting: Anesthesiology

## 2023-10-30 ENCOUNTER — Other Ambulatory Visit: Payer: Self-pay

## 2023-10-30 DIAGNOSIS — Z3A37 37 weeks gestation of pregnancy: Secondary | ICD-10-CM

## 2023-10-30 DIAGNOSIS — O26893 Other specified pregnancy related conditions, third trimester: Secondary | ICD-10-CM | POA: Diagnosis present

## 2023-10-30 DIAGNOSIS — O99214 Obesity complicating childbirth: Secondary | ICD-10-CM | POA: Diagnosis present

## 2023-10-30 DIAGNOSIS — O09891 Supervision of other high risk pregnancies, first trimester: Principal | ICD-10-CM

## 2023-10-30 DIAGNOSIS — Z8249 Family history of ischemic heart disease and other diseases of the circulatory system: Secondary | ICD-10-CM | POA: Diagnosis not present

## 2023-10-30 DIAGNOSIS — Z833 Family history of diabetes mellitus: Secondary | ICD-10-CM | POA: Diagnosis not present

## 2023-10-30 DIAGNOSIS — Z6791 Unspecified blood type, Rh negative: Secondary | ICD-10-CM

## 2023-10-30 DIAGNOSIS — O1092 Unspecified pre-existing hypertension complicating childbirth: Secondary | ICD-10-CM | POA: Diagnosis present

## 2023-10-30 DIAGNOSIS — I10 Essential (primary) hypertension: Secondary | ICD-10-CM | POA: Diagnosis present

## 2023-10-30 DIAGNOSIS — O10919 Unspecified pre-existing hypertension complicating pregnancy, unspecified trimester: Secondary | ICD-10-CM

## 2023-10-30 DIAGNOSIS — E66813 Obesity, class 3: Secondary | ICD-10-CM | POA: Diagnosis present

## 2023-10-30 DIAGNOSIS — O1002 Pre-existing essential hypertension complicating childbirth: Secondary | ICD-10-CM | POA: Diagnosis not present

## 2023-10-30 DIAGNOSIS — O0993 Supervision of high risk pregnancy, unspecified, third trimester: Secondary | ICD-10-CM

## 2023-10-30 LAB — CBC
HCT: 33.3 % — ABNORMAL LOW (ref 36.0–46.0)
HCT: 33.8 % — ABNORMAL LOW (ref 36.0–46.0)
HCT: 31.8 % — ABNORMAL LOW (ref 36.0–46.0)
Hemoglobin: 10.9 g/dL — ABNORMAL LOW (ref 12.0–15.0)
Hemoglobin: 10.8 g/dL — ABNORMAL LOW (ref 12.0–15.0)
Hemoglobin: 10.5 g/dL — ABNORMAL LOW (ref 12.0–15.0)
MCH: 30.3 pg (ref 26.0–34.0)
MCH: 28.9 pg (ref 26.0–34.0)
MCH: 30.3 pg (ref 26.0–34.0)
MCHC: 32.7 g/dL (ref 30.0–36.0)
MCHC: 32 g/dL (ref 30.0–36.0)
MCHC: 33 g/dL (ref 30.0–36.0)
MCV: 92.5 fL (ref 80.0–100.0)
MCV: 90.4 fL (ref 80.0–100.0)
MCV: 91.9 fL (ref 80.0–100.0)
Platelets: 181 10*3/uL (ref 150–400)
Platelets: 174 K/uL (ref 150–400)
Platelets: 176 10*3/uL (ref 150–400)
RBC: 3.6 MIL/uL — ABNORMAL LOW (ref 3.87–5.11)
RBC: 3.74 MIL/uL — ABNORMAL LOW (ref 3.87–5.11)
RBC: 3.46 MIL/uL — ABNORMAL LOW (ref 3.87–5.11)
RDW: 14.4 % (ref 11.5–15.5)
RDW: 14.3 % (ref 11.5–15.5)
RDW: 14.2 % (ref 11.5–15.5)
WBC: 6.7 10*3/uL (ref 4.0–10.5)
WBC: 7.6 K/uL (ref 4.0–10.5)
WBC: 8.7 10*3/uL (ref 4.0–10.5)
nRBC: 0 % (ref 0.0–0.2)
nRBC: 0 % (ref 0.0–0.2)
nRBC: 0 % (ref 0.0–0.2)

## 2023-10-30 LAB — TYPE AND SCREEN
ABO/RH(D): O NEG
Antibody Screen: POSITIVE

## 2023-10-30 LAB — COMPREHENSIVE METABOLIC PANEL WITH GFR
ALT: 16 U/L (ref 0–44)
AST: 22 U/L (ref 15–41)
Albumin: 2.7 g/dL — ABNORMAL LOW (ref 3.5–5.0)
Alkaline Phosphatase: 115 U/L (ref 38–126)
Anion gap: 11 (ref 5–15)
BUN: 9 mg/dL (ref 6–20)
CO2: 17 mmol/L — ABNORMAL LOW (ref 22–32)
Calcium: 8.7 mg/dL — ABNORMAL LOW (ref 8.9–10.3)
Chloride: 106 mmol/L (ref 98–111)
Creatinine, Ser: 0.48 mg/dL (ref 0.44–1.00)
GFR, Estimated: >60 mL/min (ref >60)
Glucose, Bld: 77 mg/dL (ref 70–99)
Potassium: 4.3 mmol/L (ref 3.5–5.1)
Sodium: 134 mmol/L — ABNORMAL LOW (ref 135–145)
Total Bilirubin: 0.5 mg/dL (ref 0.0–1.2)
Total Protein: 5.9 g/dL — ABNORMAL LOW (ref 6.5–8.1)

## 2023-10-30 LAB — RPR: RPR Ser Ql: NONREACTIVE

## 2023-10-30 MED ORDER — TERBUTALINE SULFATE 1 MG/ML IJ SOLN: 0.2500 mg | Freq: Once | INTRAMUSCULAR | Status: DC | PRN

## 2023-10-30 MED ORDER — LIDOCAINE HCL (PF) 1 % IJ SOLN: 30.0000 mL | INTRAMUSCULAR | Status: DC | PRN

## 2023-10-30 MED ORDER — DIPHENHYDRAMINE HCL 50 MG/ML IJ SOLN: 12.5000 mg | INTRAMUSCULAR | Status: DC | PRN

## 2023-10-30 MED ORDER — POTASSIUM CHLORIDE CRYS ER 20 MEQ PO TBCR
20.0000 meq | EXTENDED_RELEASE_TABLET | Freq: Every day | ORAL | Status: DC
  Administered 2023-10-30 – 2023-10-31 (×2): 20 meq via ORAL
  Filled 2023-10-30 (×2): qty 1

## 2023-10-30 MED ORDER — SODIUM CHLORIDE 0.9% FLUSH: 3.0000 mL | INTRAVENOUS | Status: DC | PRN

## 2023-10-30 MED ORDER — OXYTOCIN-SODIUM CHLORIDE 30-0.9 UT/500ML-% IV SOLN
2.5000 [IU]/h | INTRAVENOUS | Status: DC
  Administered 2023-10-30: 2.5 [IU]/h via INTRAVENOUS

## 2023-10-30 MED ORDER — ONDANSETRON HCL 4 MG PO TABS: 4.0000 mg | ORAL_TABLET | ORAL | Status: DC | PRN

## 2023-10-30 MED ORDER — SODIUM CHLORIDE 0.9% FLUSH
3.0000 mL | Freq: Two times a day (BID) | INTRAVENOUS | Status: DC
  Administered 2023-10-31: 3 mL via INTRAVENOUS

## 2023-10-30 MED ORDER — DIPHENHYDRAMINE HCL 25 MG PO CAPS: 25.0000 mg | ORAL_CAPSULE | Freq: Four times a day (QID) | ORAL | Status: DC | PRN

## 2023-10-30 MED ORDER — NIFEDIPINE ER OSMOTIC RELEASE 30 MG PO TB24
30.0000 mg | ORAL_TABLET | Freq: Every day | ORAL | Status: DC
  Administered 2023-10-30 – 2023-10-31 (×2): 30 mg via ORAL
  Filled 2023-10-30 (×2): qty 1

## 2023-10-30 MED ORDER — IBUPROFEN 800 MG PO TABS
800.0000 mg | ORAL_TABLET | Freq: Three times a day (TID) | ORAL | Status: AC
Start: 2023-10-30 — End: ?
  Administered 2023-10-30 – 2023-10-31 (×2): 800 mg via ORAL
  Filled 2023-10-30 (×2): qty 1

## 2023-10-30 MED ORDER — SENNOSIDES-DOCUSATE SODIUM 8.6-50 MG PO TABS
2.0000 | ORAL_TABLET | ORAL | Status: AC
Start: 2023-10-30 — End: ?
  Administered 2023-10-30: 2 via ORAL
  Filled 2023-10-30: qty 2

## 2023-10-30 MED ORDER — EPHEDRINE 5 MG/ML INJ: 10.0000 mg | INTRAVENOUS | Status: DC | PRN

## 2023-10-30 MED ORDER — ONDANSETRON HCL 4 MG/2ML IJ SOLN: 4.0000 mg | INTRAMUSCULAR | Status: DC | PRN

## 2023-10-30 MED ORDER — PRENATAL MULTIVITAMIN CH: 1.0000 | ORAL_TABLET | Freq: Every day | ORAL | Status: DC

## 2023-10-30 MED ORDER — LACTATED RINGERS IV SOLN: INTRAVENOUS | Status: DC

## 2023-10-30 MED ORDER — FENTANYL-BUPIVACAINE-NACL 0.5-0.125-0.9 MG/250ML-% EP SOLN
12.0000 mL/h | EPIDURAL | Status: DC | PRN
  Administered 2023-10-30: 12 mL/h via EPIDURAL
  Filled 2023-10-30: qty 250

## 2023-10-30 MED ORDER — PHENYLEPHRINE 80 MCG/ML (10ML) SYRINGE FOR IV PUSH (FOR BLOOD PRESSURE SUPPORT): 80.0000 ug | PREFILLED_SYRINGE | INTRAVENOUS | Status: DC | PRN

## 2023-10-30 MED ORDER — WITCH HAZEL-GLYCERIN EX PADS: 1.0000 | MEDICATED_PAD | CUTANEOUS | Status: DC | PRN

## 2023-10-30 MED ORDER — FUROSEMIDE 20 MG PO TABS
20.0000 mg | ORAL_TABLET | Freq: Every day | ORAL | Status: DC
  Administered 2023-10-30 – 2023-10-31 (×2): 20 mg via ORAL
  Filled 2023-10-30 (×2): qty 1

## 2023-10-30 MED ORDER — FLEET ENEMA RE ENEM: 1.0000 | ENEMA | RECTAL | Status: DC | PRN

## 2023-10-30 MED ORDER — LACTATED RINGERS IV SOLN
500.0000 mL | Freq: Once | INTRAVENOUS | Status: AC
  Administered 2023-10-30: 500 mL via INTRAVENOUS

## 2023-10-30 MED ORDER — ACETAMINOPHEN 325 MG PO TABS: 650.0000 mg | ORAL_TABLET | ORAL | Status: DC | PRN

## 2023-10-30 MED ORDER — LACTATED RINGERS IV SOLN: 500.0000 mL | INTRAVENOUS | Status: DC | PRN

## 2023-10-30 MED ORDER — BUPIVACAINE HCL (PF) 0.25 % IJ SOLN
INTRAMUSCULAR | Status: DC | PRN
  Administered 2023-10-30 (×2): 5 mL via EPIDURAL

## 2023-10-30 MED ORDER — PHENYLEPHRINE 80 MCG/ML (10ML) SYRINGE FOR IV PUSH (FOR BLOOD PRESSURE SUPPORT)
80.0000 ug | PREFILLED_SYRINGE | INTRAVENOUS | Status: DC | PRN
  Filled 2023-10-30: qty 10

## 2023-10-30 MED ORDER — ONDANSETRON HCL 4 MG/2ML IJ SOLN: 4.0000 mg | Freq: Four times a day (QID) | INTRAMUSCULAR | Status: DC | PRN

## 2023-10-30 MED ORDER — LACTATED RINGERS AMNIOINFUSION
INTRAVENOUS | Status: DC
  Administered 2023-10-30: 150 mL/h via INTRAUTERINE

## 2023-10-30 MED ORDER — SODIUM CHLORIDE 0.9 % IV SOLN: 250.0000 mL | INTRAVENOUS | Status: DC | PRN

## 2023-10-30 MED ORDER — OXYTOCIN-SODIUM CHLORIDE 30-0.9 UT/500ML-% IV SOLN
1.0000 m[IU]/min | INTRAVENOUS | Status: DC
  Administered 2023-10-30: 2 m[IU]/min via INTRAVENOUS
  Filled 2023-10-30: qty 500

## 2023-10-30 MED ORDER — DIBUCAINE (PERIANAL) 1 % EX OINT: 1.0000 | TOPICAL_OINTMENT | CUTANEOUS | Status: DC | PRN

## 2023-10-30 MED ORDER — SOD CITRATE-CITRIC ACID 500-334 MG/5ML PO SOLN: 30.0000 mL | ORAL | Status: DC | PRN

## 2023-10-30 MED ORDER — FENTANYL CITRATE (PF) 100 MCG/2ML IJ SOLN
100.0000 ug | INTRAMUSCULAR | Status: DC | PRN
  Administered 2023-10-30: 100 ug via INTRAVENOUS
  Filled 2023-10-30: qty 2

## 2023-10-30 MED ORDER — OXYTOCIN BOLUS FROM INFUSION
333.0000 mL | Freq: Once | INTRAVENOUS | Status: AC
  Administered 2023-10-30: 333 mL via INTRAVENOUS

## 2023-10-30 MED ORDER — BENZOCAINE-MENTHOL 20-0.5 % EX AERO: 1.0000 | INHALATION_SPRAY | CUTANEOUS | Status: DC | PRN

## 2023-10-30 MED ORDER — ACETAMINOPHEN 325 MG PO TABS
650.0000 mg | ORAL_TABLET | ORAL | Status: AC | PRN
Start: 2023-10-30 — End: ?

## 2023-10-30 MED ORDER — SIMETHICONE 80 MG PO CHEW: 80.0000 mg | CHEWABLE_TABLET | ORAL | Status: DC | PRN

## 2023-10-30 MED ORDER — COCONUT OIL OIL: 1.0000 | TOPICAL_OIL | Status: DC | PRN

## 2023-10-30 MED ORDER — HYDROXYZINE HCL 50 MG PO TABS: 50.0000 mg | ORAL_TABLET | Freq: Four times a day (QID) | ORAL | Status: DC | PRN

## 2023-10-30 MED ORDER — LIDOCAINE-EPINEPHRINE (PF) 2 %-1:200000 IJ SOLN
INTRAMUSCULAR | Status: DC | PRN
  Administered 2023-10-30: 5 mL via EPIDURAL

## 2023-10-30 NOTE — Anesthesia Preprocedure Evaluation (Signed)
 Anesthesia Evaluation  Patient identified by MRN, date of birth, ID band Patient awake    Reviewed: Allergy & Precautions, NPO status , Patient's Chart, lab work & pertinent test results, reviewed documented beta blocker date and time   History of Anesthesia Complications Negative for: history of anesthetic complications  Airway Mallampati: III  TM Distance: >3 FB     Dental no notable dental hx.    Pulmonary asthma    breath sounds clear to auscultation       Cardiovascular hypertension, (-) CAD, (-) Past MI and (-) Cardiac Stents  Rhythm:Regular Rate:Normal     Neuro/Psych  Headaches, neg Seizures    GI/Hepatic   Endo/Other    Class 3 obesity  Renal/GU      Musculoskeletal   Abdominal   Peds  Hematology   Anesthesia Other Findings   Reproductive/Obstetrics (+) Pregnancy                             Anesthesia Physical Anesthesia Plan  ASA: 3  Anesthesia Plan: Epidural   Post-op Pain Management:    Induction:   PONV Risk Score and Plan:   Airway Management Planned:   Additional Equipment:   Intra-op Plan:   Post-operative Plan:   Informed Consent: I have reviewed the patients History and Physical, chart, labs and discussed the procedure including the risks, benefits and alternatives for the proposed anesthesia with the patient or authorized representative who has indicated his/her understanding and acceptance.       Plan Discussed with:   Anesthesia Plan Comments:        Anesthesia Quick Evaluation

## 2023-10-30 NOTE — Progress Notes (Signed)
IFSE removed with tip intact 

## 2023-10-30 NOTE — H&P (Signed)
 OBSTETRIC ADMISSION HISTORY AND PHYSICAL  Kerry Johns is a 18 y.o. female G2P1001 with IUP at [redacted]w[redacted]d by 6 week US  presenting for IOL for CHTN on Procardia . She reports +FMs, No LOF, no VB, no blurry vision, headaches or peripheral edema, and RUQ pain.  She undecided about feeding. She request Mirena at post partum visit for birth control. She received her prenatal care at [facility]   Dating: By 6 week US  --->  Estimated Date of Delivery: 11/20/23  Sono:   @[redacted]w[redacted]d , CWD, normal anatomy, cephalic presentation, posterior placental lie, 3155  grams, 77 %  EFW   Prenatal History/Complications:  - CHTN on Procardia  30 mg  - PG BMI 45 - Rh negative - Short interval pregnancy  Past Medical History: Past Medical History:  Diagnosis Date   Asthma    as a child   Headache    Hypertension     Past Surgical History: Past Surgical History:  Procedure Laterality Date   ANTERIOR CRUCIATE LIGAMENT REPAIR     arm surgery     CHOLECYSTECTOMY      Obstetrical History: OB History     Gravida  2   Para  1   Term  1   Preterm      AB      Living  1      SAB      IAB      Ectopic      Multiple      Live Births  1           Social History Social History   Socioeconomic History   Marital status: Single    Spouse name: Not on file   Number of children: Not on file   Years of education: Not on file   Highest education level: Not on file  Occupational History   Not on file  Tobacco Use   Smoking status: Never   Smokeless tobacco: Never  Vaping Use   Vaping status: Never Used  Substance and Sexual Activity   Alcohol use: No   Drug use: No   Sexual activity: Yes    Birth control/protection: None  Other Topics Concern   Not on file  Social History Narrative   Not on file   Social Drivers of Health   Financial Resource Strain: Low Risk  (11/17/2022)   Received from [facility]   Overall Financial Resource Strain (CARDIA)    Difficulty of Paying  Living Expenses: Not very hard  Food Insecurity: No Food Insecurity (11/17/2022)   Received from [facility]   Hunger Vital Sign    Within the past 12 months, you worried that your food would run out before you got the money to buy more.: Never true    Within the past 12 months, the food you bought just didn't last and you didn't have money to get more.: Never true  Transportation Needs: No Transportation Needs (11/17/2022)   Received from [facility]   PRAPARE - Transportation    Lack of Transportation (Medical): No    Lack of Transportation (Non-Medical): No  Physical Activity: Insufficiently Active (05/19/2022)   Exercise Vital Sign    Days of Exercise per Week: 2 days    Minutes of Exercise per Session: 30 min  Stress: Stress Concern Present (05/19/2022)   [employer] of Occupational Health - Occupational Stress Questionnaire    Feeling of Stress : To some extent  Social Connections: Moderately Integrated (05/19/2022)  Social Advertising account executive    Frequency of Communication with Friends and Family: More than three times a week    Frequency of Social Gatherings with Friends and Family: Twice a week    Attends Religious Services: More than 4 times per year    Active Member of [employer] or Organizations: Yes    Attends Engineer, structural: More than 4 times per year    Marital Status: Never married    Family History: Family History  Problem Relation Age of Onset   Diabetes Mother    Hypertension Mother    Hypertension Father    Cancer Maternal Grandmother    Breast cancer Maternal Grandmother    Cancer Maternal Grandfather    Hypertension Paternal Grandmother    Lung cancer Paternal Grandfather     Allergies: Allergies  Allergen Reactions   Oxycodone Other (See Comments)    Stops breathing    No medications prior to admission.     Review of Systems   All systems reviewed and negative except as stated in HPI  Last menstrual period  02/04/2023, unknown if currently breastfeeding. General appearance: alert, cooperative, and no distress Lungs: clear to auscultation bilaterally Heart: regular rate and rhythm Abdomen: soft, non-tender; bowel sounds normal Pelvic:  Dilation: 3 Effacement (%): 50 Station: -2 (6/10),  AROM'd clear fluid Extremities: Homans sign is negative, no sign of DVT DTR's 2+ Presentation: cephalic Fetal monitoringBaseline: 140 bpm, Variability: Good {> 6 bpm), Accelerations: 10x10, and Decelerations: Absent Uterine activityFrequency: Every 3-5  minutes and Intensity: mild     Prenatal labs: ABO, Rh: O/Negative/-- (12/23 1227) Antibody: Negative (04/07 0844) Rubella: 1.36 (12/23 1227) RPR: Non Reactive (04/07 0844)  HBsAg: Negative (12/23 1227)  HIV: Non Reactive (04/07 0844)  GBS: Negative/-- (06/10 1133)    Lab Results  Component Value Date   GBS Negative 10/25/2023   GTT normal Genetic screening  negative Horizon; Panorama insufficient Anatomy US  normal  Immunization History  Administered Date(s) Administered   DTaP 04/27/2004, 06/30/2004, 08/26/2004, 01/18/2006, 09/23/2009   HIB (PRP-OMP) 04/27/2004, 06/30/2004, 01/18/2006   Hepatitis A 01/18/2006, 12/18/2007   Hepatitis B 2004/04/11, 03/26/2004, 12/09/2004   Hpv-Unspecified 12/05/2015   IPV 04/27/2004, 06/30/2004, 12/09/2004, 09/23/2009   MMR 04/23/2005, 09/23/2009   Meningococcal polysaccharide vaccine (MPSV4) 12/05/2015   Pneumococcal Conjugate-13 04/27/2004, 06/30/2004, 08/26/2004, 04/23/2005   Rho (D) Immune Globulin  04/27/2022, 09/05/2023   Tdap 12/05/2015   Varicella 04/23/2005, 09/23/2009    Prenatal Transfer Tool  Maternal Diabetes: No Genetic Screening: Normal Maternal Ultrasounds/Referrals: Normal Fetal Ultrasounds or other Referrals:  None Maternal Substance Abuse:  No Significant Maternal Medications:  None Significant Maternal Lab Results: Group B Strep negative and Rh negative Number of Prenatal  Visits:greater than 3 verified prenatal visits Maternal Vaccinations:TDap Other Comments:  None   No results found for this or any previous visit (from the past 24 hours).  Patient Active Problem List   Diagnosis Date Noted   Short interval between pregnancies affecting pregnancy in first trimester, antepartum 05/09/2023   Supervision of high-risk pregnancy 05/06/2023   Chronic hypertension during pregnancy, antepartum 07/01/2022   Obesity affecting pregnancy 05/19/2022   Rh negative state in antepartum period 04/27/2022    Assessment/Plan:  Kerry Johns is a 18 y.o. G2P1001 at [redacted]w[redacted]d here for IOL for CHTN on Procardia   #Labor: Favorable cervix in office; Pitocin, now AROM'd clear fluid  #Pain: Per pt request #FWB: Cat 1 #GBS status:  negative #Feeding: Breastmilk  #Reproductive Life planning: IUD Mirena  at post partum visit #Circ:  not applicable  #CHTN on Procardia , mild range, asymptomatic - continue procardia  - preeclampsia labs pending  Kathyrn Warmuth  Felipe Horton, CNM 10/30/2023 11:46 AM

## 2023-10-30 NOTE — Plan of Care (Signed)
Pt demonstrated understanding 

## 2023-10-30 NOTE — Anesthesia Procedure Notes (Signed)
 Epidural Patient location during procedure: OB Start time: 10/30/2023 2:28 PM End time: 10/30/2023 2:36 PM  Staffing Anesthesiologist: Leslye Rast, MD Performed: anesthesiologist   Preanesthetic Checklist Completed: patient identified, IV checked, site marked, risks and benefits discussed, surgical consent, monitors and equipment checked, pre-op evaluation and timeout performed  Epidural Patient position: sitting Prep: DuraPrep Patient monitoring: heart rate, continuous pulse ox and blood pressure Approach: midline Location: L4-L5 Injection technique: LOR saline  Needle:  Needle type: Tuohy  Needle gauge: 17 G Needle length: 9 cm Needle insertion depth: 8 cm Catheter type: closed end flexible Catheter size: 19 Gauge Catheter at skin depth: 13 cm Test dose: negative and 1.5% lidocaine with Epi 1:200 K  Assessment Events: blood not aspirated, no cerebrospinal fluid, injection not painful, no injection resistance, no paresthesia and negative IV test  Additional Notes Reason for block:procedure for pain

## 2023-10-30 NOTE — Progress Notes (Signed)
 Attempting to apply us 

## 2023-10-30 NOTE — Progress Notes (Signed)
 Still attempting to place us /  FHR audible but monitor not tracing fhr

## 2023-10-30 NOTE — Discharge Summary (Signed)
 Postpartum Discharge Summary  Date of Service updated***     Patient Name: Kerry Johns DOB: January 26, 2006 MRN: [id number]  Date of admission: 10/30/2023 Delivery date:10/30/2023 Delivering provider: Melanie Spires Date of discharge: 10/30/2023  Admitting diagnosis: Chronic hypertension [I10] Intrauterine pregnancy: [redacted]w[redacted]d     Secondary diagnosis:  Principal Problem:   SVD (spontaneous vaginal delivery) Active Problems:   Chronic hypertension  Additional problems: ***    Discharge diagnosis: Term Pregnancy Delivered and CHTN                                              Post partum procedures:{Postpartum procedures:23558} Augmentation: AROM and Pitocin Complications: Shoulder dystocia -- <1 min, resolved w delivery of posterior arm  Hospital course: Induction of Labor With Vaginal Delivery   18 y.o. yo G2P1001 at [redacted]w[redacted]d was admitted to the hospital 10/30/2023 for induction of labor.  Indication for induction: chronic hypertension.  Patient had an labor course complicated by recurrent deep variable decels noted, likely in setting of nuchal cord and quick progression in active stage of labor. Membrane Rupture Time/Date: 9:30 AM,10/30/2023  Delivery Method:Vaginal, Spontaneous Operative Delivery:N/A Episiotomy: None Lacerations:  None Details of delivery can be found in separate delivery note.  Patient had a postpartum course complicated by***. Patient is discharged home 10/30/23.  Newborn Data: Birth date:10/30/2023 Birth time:5:48 PM Gender:Female Living status:Living Apgars:8 ,9  Weight:   Magnesium Sulfate received: {Mag received:30440022} BMZ received: No Rhophylac :{Rhophylac  received:30440032} MMR:N/A T-DaP:Given prenatally Flu: No RSV Vaccine received: No Transfusion:{Transfusion received:30440034}  Immunizations received: Immunization History  Administered Date(s) Administered   DTaP 04/27/2004, 06/30/2004, 08/26/2004, 01/18/2006, 09/23/2009   HIB (PRP-OMP)  04/27/2004, 06/30/2004, 01/18/2006   Hepatitis A 01/18/2006, 12/18/2007   Hepatitis B 11-15-03, 03/26/2004, 12/09/2004   Hpv-Unspecified 12/05/2015   IPV 04/27/2004, 06/30/2004, 12/09/2004, 09/23/2009   MMR 04/23/2005, 09/23/2009   Meningococcal polysaccharide vaccine (MPSV4) 12/05/2015   Pneumococcal Conjugate-13 04/27/2004, 06/30/2004, 08/26/2004, 04/23/2005   Rho (D) Immune Globulin  04/27/2022, 09/05/2023   Tdap 12/05/2015   Varicella 04/23/2005, 09/23/2009    Physical exam  Vitals:   10/30/23 1630 10/30/23 1700 10/30/23 1730 10/30/23 1804  BP: (!) 136/55 125/71 132/71 (!) 107/57  Pulse: 98 (!) 102 95 (!) 142  Resp: 16 18  18   Temp:      TempSrc:      Weight:      Height:       General: {Exam; general:21111117} Lochia: {Desc; appropriate/inappropriate:30686::appropriate} Uterine Fundus: {Desc; firm/soft:30687} Incision: {Exam; incision:21111123} DVT Evaluation: {Exam; dvt:2111122} Labs: Lab Results  Component Value Date   WBC 7.6 10/30/2023   HGB 10.8 (L) 10/30/2023   HCT 33.8 (L) 10/30/2023   MCV 90.4 10/30/2023   PLT 174 10/30/2023      Latest Ref Rng & Units 10/30/2023    8:11 AM  CMP  Glucose 70 - 99 mg/dL 77   BUN 6 - 20 mg/dL 9   Creatinine 7.84 - 6.96 mg/dL 2.95   Sodium 284 - 132 mmol/L 134   Potassium 3.5 - 5.1 mmol/L 4.3   Chloride 98 - 111 mmol/L 106   CO2 22 - 32 mmol/L 17   Calcium 8.9 - 10.3 mg/dL 8.7   Total Protein 6.5 - 8.1 g/dL 5.9   Total Bilirubin 0.0 - 1.2 mg/dL 0.5   Alkaline Phos 38 - 126 U/L 115   AST 15 -  41 U/L 22   ALT 0 - 44 U/L 16    Edinburgh Score:     No data to display         No data recorded  After visit meds:  Allergies as of 10/30/2023       Reactions   Oxycodone Other (See Comments)   Stops breathing     Med Rec must be completed prior to using this [facility]***        Discharge home in stable condition Infant Feeding: Breast Infant Disposition:home with mother Discharge instruction: per  After Visit Summary and Postpartum booklet. Activity: Advance as tolerated. Pelvic rest for 6 weeks.  Diet: carb modified diet Future Appointments:No future appointments. Follow up Visit: Message sent to FT 6/15  Please schedule this patient for a In person postpartum visit in {Postpartum visit:23953} with the following provider: Any provider. Additional Postpartum F/U:BP check 1 week  High risk pregnancy complicated by: HTN Delivery mode:  Vaginal, Spontaneous Anticipated Birth Control:  IUD   10/30/2023 Melanie Spires, MD

## 2023-10-30 NOTE — Progress Notes (Signed)
 Attempting to apply us .  Able to hear fhr but monitor not tracing fhr

## 2023-10-30 NOTE — Lactation Note (Signed)
 This note was copied from a baby's chart. Lactation Consultation Note  Patient Name: Kerry Johns [id number] Date: 10/30/2023 Age:20 hours Reason for consult: Initial assessment;Early term 37-38.6wks  P2- MOB reports that infant is nursing well so far. MOB was able to breastfeed her first child for 12 hrs because he liked the formula better. MOB would like to breastfeed this child, but reports that she will use formula if she has to. MOB reported that infant had just finished nursing, so LC encouraged MOB to call for a latch assessment at some point. MOB denies having any questions or concerns at this time. LC reviewed the first 24 hr birthday nap, day 2 cluster feeding, feeding infant on cue 8-12x in 24 hrs, not allowing infant to go over 3 hrs without a feeding, CDC milk storage guidelines, LC services handout and engorgement/breast care. LC encouraged MOB to call for further assistance as needed. [facility] referral sent today.  Maternal Data Has patient been taught Hand Expression?: No Does the patient have breastfeeding experience prior to this delivery?: Yes How long did the patient breastfeed?: 12 hrs, per MOB her first child liked formula more than her breast milk  Feeding Mother's Current Feeding Choice: Breast Milk  LATCH Score Latch: Repeated attempts needed to sustain latch, nipple held in mouth throughout feeding, stimulation needed to elicit sucking reflex.  Audible Swallowing: A few with stimulation  Type of Nipple: Everted at rest and after stimulation  Comfort (Breast/Nipple): Soft / non-tender  Hold (Positioning): Assistance needed to correctly position infant at breast and maintain latch.  LATCH Score: 7   Lactation Tools Discussed/Used Pump Education: Milk Storage  Interventions Interventions: Breast feeding basics reviewed;Education;LC Services brochure  Discharge Discharge Education: Engorgement and breast care;Warning signs for feeding baby Pump: Hands  Free;Personal;Referral sent for [facility] Pump  Consult Status Consult Status: Follow-up Date: 10/31/23 Follow-up type: In-patient    Vernette Goo BS, IBCLC 10/30/2023, 9:38 PM

## 2023-10-30 NOTE — Progress Notes (Signed)
 From [phone] RN at bedside attempting to adjust u/s for fhr

## 2023-10-30 NOTE — Progress Notes (Signed)
 Labor progress note:   Pt here for IOL due to cHTN on Procardia  30mg . There has been difficulty in tracing FHR and ctx pattern, so discussed placement of FSE and IUPC. Patient provided verbal consent. FSE and IUPC placed, mom and baby tolerated well. New variable decels noted on FHR after FSE placed -- will start amnioinfusion.   BP have been normotensive.   Melanie Spires, MD OB Fellow, Faculty Practice [employer], Center for [facility]

## 2023-10-31 ENCOUNTER — Encounter: Admitting: Women's Health

## 2023-10-31 ENCOUNTER — Other Ambulatory Visit (HOSPITAL_COMMUNITY): Payer: Self-pay

## 2023-10-31 ENCOUNTER — Other Ambulatory Visit: Admitting: Radiology

## 2023-10-31 MED ORDER — SENNOSIDES-DOCUSATE SODIUM 8.6-50 MG PO TABS
2.0000 | ORAL_TABLET | ORAL | 0 refills | Status: AC
  Filled 2023-10-31: qty 30, 15d supply, fill #0

## 2023-10-31 MED ORDER — FUROSEMIDE 20 MG PO TABS
20.0000 mg | ORAL_TABLET | Freq: Every day | ORAL | 0 refills | Status: AC
Start: 2023-11-01 — End: ?
  Filled 2023-10-31: qty 5, 5d supply, fill #0

## 2023-10-31 MED ORDER — POTASSIUM CHLORIDE CRYS ER 20 MEQ PO TBCR
20.0000 meq | EXTENDED_RELEASE_TABLET | Freq: Every day | ORAL | 0 refills | Status: AC
  Filled 2023-10-31: qty 5, 5d supply, fill #0

## 2023-10-31 MED ORDER — ACETAMINOPHEN 325 MG PO TABS
650.0000 mg | ORAL_TABLET | ORAL | 0 refills | Status: AC | PRN
  Filled 2023-10-31: qty 30, 3d supply, fill #0

## 2023-10-31 MED ORDER — IBUPROFEN 800 MG PO TABS
800.0000 mg | ORAL_TABLET | Freq: Three times a day (TID) | ORAL | 0 refills | Status: AC
  Filled 2023-10-31: qty 30, 10d supply, fill #0

## 2023-10-31 NOTE — Progress Notes (Addendum)
 POSTPARTUM PROGRESS NOTE  Post Partum Day 1  Subjective:  Kerry Johns is a 18 y.o. [id number] s/p SVD at [redacted]w[redacted]d.  She reports she is doing well. No acute events overnight. She denies any problems with ambulating, voiding or po intake. Denies nausea or vomiting.  Pain is well controlled.  Lochia is appropriate.  Objective: Blood pressure 130/75, pulse 89, temperature 98.3 F (36.8 C), temperature source Oral, resp. rate 18, height 5' 8 (1.727 m), weight (!) 138.1 kg, last menstrual period 02/04/2023, SpO2 99%, unknown if currently breastfeeding.  Physical Exam:  General: alert, cooperative and no distress Chest: no respiratory distress Heart:regular rate, distal pulses intact Uterine Fundus: firm, appropriately tender DVT Evaluation: No calf swelling or tenderness Extremities: trace edema Skin: warm, dry  Recent Labs    10/30/23 1339 10/30/23 1839  HGB 10.8* 10.5*  HCT 33.8* 31.8*    Assessment/Plan: Kerry Johns is a 18 y.o. [id number] s/p SVD at [redacted]w[redacted]d   PPD#1 - Doing well  Routine postpartum care cHTN - BP <160/<110 on Procardia  30mg  and Lasix+K, asymptomatic Rh neg - Baby O neg so no need for Rhogam Contraception: OP mirena Feeding: breast Dispo: Plan for discharge 6/16.   LOS: 1 day   Ebony Goldstein, MD OB Fellow  10/31/2023, 3:05 PM

## 2023-10-31 NOTE — Anesthesia Postprocedure Evaluation (Signed)
 Anesthesia Post Note  Patient: Fayrene Hope  Procedure(s) Performed: AN AD HOC LABOR EPIDURAL     Patient location during evaluation: Mother Baby Anesthesia Type: Epidural Level of consciousness: awake and alert and oriented Pain management: satisfactory to patient Vital Signs Assessment: post-procedure vital signs reviewed and stable Respiratory status: respiratory function stable Cardiovascular status: stable Postop Assessment: no headache, no backache, epidural receding, patient able to bend at knees, no signs of nausea or vomiting, adequate PO intake and able to ambulate Anesthetic complications: no   No notable events documented.  Last Vitals:  Vitals:   10/31/23 0146 10/31/23 0521  BP: 135/79 136/82  Pulse: 97 91  Resp: 16 18  Temp: 36.6 C 36.6 C  SpO2: 100% 99%    Last Pain:  Vitals:   10/31/23 0521  TempSrc: Oral  PainSc:    Pain Goal:                   Garry Bochicchio

## 2023-10-31 NOTE — Lactation Note (Signed)
 This note was copied from a baby's chart. Lactation Consultation Note  Patient Name: Kerry Johns [id number] Date: 10/31/2023 Age:20 hours Reason for consult: Maternal discharge;Follow-up assessment;Difficult latch;Early term 37-38.6wks.  MOB has been having latch difficulties, infant does not sustained her latch. MOB recently attempted latch infant for 3 minutes and afterwards infant given 10 mls of formula. Infant was cuing to feed while LC was in the room. MOB was open to latch infant again at the breast  with [facility] assistance. LC observe infant sucks her tongue and MOB nipple re-tracks after few minutes of infant latching at the breast . LC fitted MOB with 24 mm NS, infant was on and off the breast fore 10 minutes but LC saw latch improvement towards the end of the feeding and colostrum was present in the nipple shield. Infant became fussy, due to having void diaper that FOB changed. Afterwards, was  RN present to do  vitals.  FOB plans to offer infant formula aware infant may be cluster feeding on Day 2 of life and need higher volume of supplement.  .Family is being discharged today  Per Family, they have a  toddler at home and caring for a  younger sibling at home, they are planning to be discharged today. MOB informed LC she has LC at the Pediatrician to follow up with regarding breastfeeding and LC discussed  Breastfeeding Support Group and LC hotline. LC observed infant has uncoordinated suck at the breast and been using a pacifier. MOB does want to breastfeed she had breastfeeding problems with her first child. MOB informed LC she has used NS before but briefly stopped breastfeeding short after being discharged from [facility] with her first child.   Current feeding plan: 1- LC suggested latch infant first every feeding use NS to help with latch.  2- Breast feed by cues, on demand, 8+ times within 24 hours, LC discussed hunger cues and signs of satiety in infant. 3- MOB given  hand out Supplementing with Breastfeeding Day 2 of infant is briefly latching less than 5 minutes to supplement infant (15-30 mls) but if infant is latching well offer (7-12 mls ) per feeding or more if infant wants it. 4- LC suggested she use her DEBP at home and pump every 3 hours to help stimulate and establish her milk supply as she continue to work on latching infant at the breast. 5- Delay pacifier usage until 3 weeks postpartum.  6- LC suggested MOB reach out to the [facility] at her Pediatrician office for continue to breastfeeding support and assistance.    Discharge Education: 1- LC discussed engorgement prevention and treatment, 2- How to knows if breastfeeding is going well,3- Warning signs of dehydration Maternal Data    Feeding Mother's Current Feeding Choice: Breast Milk and Formula  LATCH Score Latch: Repeated attempts needed to sustain latch, nipple held in mouth throughout feeding, stimulation needed to elicit sucking reflex. (Used 24 mm NS)  Audible Swallowing: A few with stimulation  Type of Nipple: Inverted  Comfort (Breast/Nipple): Soft / non-tender         Lactation Tools Discussed/Used    Interventions Interventions: Assisted with latch;Support pillows;Adjust position;Position options;Reverse pressure;Education;Guidelines for Milk Supply and Pumping Schedule Handout;LC Services brochure;CDC milk storage guidelines;CDC Guidelines for Breast Pump Cleaning  Discharge Pump: DEBP;Personal  Consult Status Consult Status: Complete Date: 11/01/23 Follow-up type: Physician    Pecolia Bourbon 10/31/2023, 6:35 PM

## 2023-11-01 ENCOUNTER — Other Ambulatory Visit: Admitting: Radiology

## 2023-11-01 ENCOUNTER — Encounter: Admitting: Obstetrics & Gynecology

## 2023-11-03 ENCOUNTER — Other Ambulatory Visit

## 2023-11-04 ENCOUNTER — Other Ambulatory Visit

## 2023-11-06 ENCOUNTER — Other Ambulatory Visit: Payer: Self-pay

## 2023-11-06 ENCOUNTER — Encounter (HOSPITAL_COMMUNITY): Payer: Self-pay

## 2023-11-06 ENCOUNTER — Inpatient Hospital Stay (HOSPITAL_COMMUNITY)
Admission: EM | Admit: 2023-11-06 | Discharge: 2023-11-06 | Disposition: A | Discharge status: Home / Self Care | Attending: Obstetrics and Gynecology | Admitting: Obstetrics and Gynecology

## 2023-11-06 ENCOUNTER — Inpatient Hospital Stay (HOSPITAL_COMMUNITY)

## 2023-11-06 DIAGNOSIS — R109 Unspecified abdominal pain: Secondary | ICD-10-CM | POA: Insufficient documentation

## 2023-11-06 DIAGNOSIS — R110 Nausea: Secondary | ICD-10-CM | POA: Insufficient documentation

## 2023-11-06 DIAGNOSIS — O9089 Other complications of the puerperium, not elsewhere classified: Secondary | ICD-10-CM | POA: Diagnosis not present

## 2023-11-06 LAB — COMPREHENSIVE METABOLIC PANEL WITH GFR
ALT: 25 U/L (ref 0–44)
AST: 20 U/L (ref 15–41)
Albumin: 3 g/dL — ABNORMAL LOW (ref 3.5–5.0)
Alkaline Phosphatase: 85 U/L (ref 38–126)
Anion gap: 11 (ref 5–15)
BUN: 5 mg/dL — ABNORMAL LOW (ref 6–20)
CO2: 23 mmol/L (ref 22–32)
Calcium: 8.9 mg/dL (ref 8.9–10.3)
Chloride: 105 mmol/L (ref 98–111)
Creatinine, Ser: 0.77 mg/dL (ref 0.44–1.00)
GFR, Estimated: >60 mL/min (ref >60)
Glucose, Bld: 97 mg/dL (ref 70–99)
Potassium: 3.8 mmol/L (ref 3.5–5.1)
Sodium: 139 mmol/L (ref 135–145)
Total Bilirubin: 0.5 mg/dL (ref 0.0–1.2)
Total Protein: 6.4 g/dL — ABNORMAL LOW (ref 6.5–8.1)

## 2023-11-06 LAB — CBC
HCT: 35.8 % — ABNORMAL LOW (ref 36.0–46.0)
Hemoglobin: 11.4 g/dL — ABNORMAL LOW (ref 12.0–15.0)
MCH: 29.5 pg (ref 26.0–34.0)
MCHC: 31.8 g/dL (ref 30.0–36.0)
MCV: 92.5 fL (ref 80.0–100.0)
Platelets: 249 10*3/uL (ref 150–400)
RBC: 3.87 MIL/uL (ref 3.87–5.11)
RDW: 13.2 % (ref 11.5–15.5)
WBC: 8.9 10*3/uL (ref 4.0–10.5)
nRBC: 0 % (ref 0.0–0.2)

## 2023-11-06 MED ORDER — ACETAMINOPHEN 500 MG PO TABS
1000.0000 mg | ORAL_TABLET | Freq: Once | ORAL | Status: AC
  Administered 2023-11-06: 1000 mg via ORAL
  Filled 2023-11-06: qty 2

## 2023-11-06 MED ORDER — IBUPROFEN 800 MG PO TABS
800.0000 mg | ORAL_TABLET | Freq: Once | ORAL | Status: AC
  Administered 2023-11-06: 800 mg via ORAL
  Filled 2023-11-06: qty 1

## 2023-11-06 MED ORDER — DOXYCYCLINE HYCLATE 100 MG PO CAPS
100.0000 mg | ORAL_CAPSULE | Freq: Two times a day (BID) | ORAL | 0 refills | Status: AC
Start: 2023-11-06 — End: ?

## 2023-11-06 MED ORDER — MISOPROSTOL 200 MCG PO TABS
800.0000 ug | ORAL_TABLET | Freq: Once | ORAL | Status: AC
Start: 2023-11-06 — End: 2023-11-06
  Administered 2023-11-06: 800 ug via ORAL
  Filled 2023-11-06: qty 4

## 2023-11-06 MED ORDER — MISOPROSTOL 200 MCG PO TABS: ORAL_TABLET | ORAL | 0 refills | Status: AC

## 2023-11-06 MED ORDER — MISOPROSTOL 200 MCG PO TABS
ORAL_TABLET | ORAL | 0 refills | Status: AC
Start: 2023-11-06 — End: ?

## 2023-11-06 NOTE — Discharge Instructions (Addendum)
 Ms. Buller,  You were seen tonight in MAU for questionable retained products of conception and subinvolution. We are prescribing medications to help you pass the rest of the contents of your uterus and to control infection.   You should take Doxycycline twice daily for 10 days. This is an antibiotic, it is important to take all of this medication, even if you start to feel better.  You should take Cytotec 400mcg orally, three times daily, for three days.   Thank you for trusting us  to care for you, Camie, Midwife

## 2023-11-06 NOTE — MAU Note (Signed)
 Kerry Johns is a 18 y.o. at Unknown here in MAU reporting: ongoing abdominal pain since the 16th after being discharged. The cramping has continued and worsened throughout the week. States she passed a large blood clot. States the pain is making her nauseated. Pt pointed to the moderate bleeding picture in triage, is wearing a yellow pad.  Onset of complaint: May 16th  Pain score: 8 Vitals:   11/06/23 1632  BP: 137/80  Pulse: (!) 104  Resp: 18  Temp: 100.2 F (37.9 C)  SpO2: 99%      Lab orders placed from triage:

## 2023-11-06 NOTE — MAU Provider Note (Cosign Needed Addendum)
 Chief Complaint:  Vaginal Bleeding and Abdominal Pain   None     HPI  Kerry Johns is a 18 y.o. [id number] PPD#7 who presents to maternity admissions reporting diffuse dull abdominal pain and increased vaginal bleeding that has been ongoing since delivery, and has progressively worsened each day. She decided to come in today after passing a golf ball sized blood clot. She reports that she frequently notices stringy clots in her pad, but this was the first time she saw a clot this big. The pain is constant and worse when she is pumping, using the bathroom, or moving about the house and improves slightly when she rests. It also causes intermittent nausea but no vomiting so far. She rates it 4/10. Tried taking tylenol  and ibuprofen  at different times for a few days but found it to be ineffective and has not taken anything for pain in the last few days. Denies any dizziness, HA, vision changes, isolated epigastric pain, dizziness, fever, or chills.  Past Medical History: Past Medical History:  Diagnosis Date   Asthma    as a child   Headache    Hypertension     Past obstetric history: OB History  Gravida Para Term Preterm AB Living  2 2 2   2   SAB IAB Ectopic Multiple Live Births     0 2    # Outcome Date GA Lbr Len/2nd Weight Sex Type Anes PTL Lv  2 Term 10/30/23 [redacted]w[redacted]d 04:37 / 00:06 7 lb 1.6 oz (3.22 kg) F Vag-Spont EPI  LIV  1 Term 11/16/22 [redacted]w[redacted]d  7 lb 3 oz (3.26 kg) F Vag-Spont EPI N LIV     Complications: Chronic hypertension    Past Surgical History: Past Surgical History:  Procedure Laterality Date   ANTERIOR CRUCIATE LIGAMENT REPAIR     arm surgery     CHOLECYSTECTOMY      Family History: Family History  Problem Relation Age of Onset   Diabetes Mother    Hypertension Mother    Hypertension Father    Cancer Maternal Grandmother    Breast cancer Maternal Grandmother    Cancer Maternal Grandfather    Hypertension Paternal Grandmother    Lung cancer Paternal  Grandfather     Social History: Social History   Tobacco Use   Smoking status: Never   Smokeless tobacco: Never  Vaping Use   Vaping status: Never Used  Substance Use Topics   Alcohol use: No   Drug use: No    Allergies:  Allergies  Allergen Reactions   Oxycodone Other (See Comments)    Stops breathing    Meds:  No medications prior to admission.    I have reviewed patient's Past Medical Hx, Surgical Hx, Family Hx, Social Hx, medications and allergies.  ROS  Pertinent items noted in HPI, comprehensive review of systems otherwise negative.  PHYSICAL EXAM  Patient Vitals for the past 24 hrs:  BP Temp Temp src Pulse Resp SpO2 Height Weight  11/06/23 2203 124/63 99.7 F (37.6 C) Oral 90 18 99 % -- --  11/06/23 2018 -- -- -- -- -- 100 % -- --  11/06/23 2002 (!) 116/49 98.5 F (36.9 C) Oral 96 17 100 % -- --  11/06/23 1733 130/75 99 F (37.2 C) Oral (!) 109 15 98 % 5' 7.5 (1.715 m) 286 lb 11.2 oz (130 kg)  11/06/23 1634 -- -- -- -- -- -- 5' 7.5 (1.715 m) (!) 302 lb (137 kg)  11/06/23 1632 137/80  100.2 F (37.9 C) -- (!) 104 18 99 % -- --   Constitutional: Well-developed, well-nourished female in no acute distress.  Cardiovascular: slightly tachycardic, warm and well perfused Respiratory: normal effort, no distress GI: Abd soft, tender diffusely to palpation, non-distended MS: Extremities nontender, no edema, normal ROM Neurologic: Alert and oriented x 4. Skin:  Warm and Dry Psych:  Affect appropriate. Pelvic: Cervix pink, visually 1-2 cm open, No CMT, small amount of lochia rubra, vaginal walls and external genitalia normal Chaperone present Labs: Results for orders placed or performed during the hospital encounter of 11/06/23 (from the past 24 hours)  CBC     Status: Abnormal   Collection Time: 11/06/23  7:31 PM  Result Value Ref Range   WBC 8.9 4.0 - 10.5 K/uL   RBC 3.87 3.87 - 5.11 MIL/uL   Hemoglobin 11.4 (L) 12.0 - 15.0 g/dL   HCT 64.1 (L) 63.9 - 53.9  %   MCV 92.5 80.0 - 100.0 fL   MCH 29.5 26.0 - 34.0 pg   MCHC 31.8 30.0 - 36.0 g/dL   RDW 86.7 88.4 - 84.4 %   Platelets 249 150 - 400 K/uL   nRBC 0.0 0.0 - 0.2 %  Comprehensive metabolic panel     Status: Abnormal   Collection Time: 11/06/23  7:31 PM  Result Value Ref Range   Sodium 139 135 - 145 mmol/L   Potassium 3.8 3.5 - 5.1 mmol/L   Chloride 105 98 - 111 mmol/L   CO2 23 22 - 32 mmol/L   Glucose, Bld 97 70 - 99 mg/dL   BUN 5 (L) 6 - 20 mg/dL   Creatinine, Ser 9.22 0.44 - 1.00 mg/dL   Calcium 8.9 8.9 - 89.6 mg/dL   Total Protein 6.4 (L) 6.5 - 8.1 g/dL   Albumin 3.0 (L) 3.5 - 5.0 g/dL   AST 20 15 - 41 U/L   ALT 25 0 - 44 U/L   Alkaline Phosphatase 85 38 - 126 U/L   Total Bilirubin 0.5 0.0 - 1.2 mg/dL   GFR, Estimated >39 >39 mL/min   Anion gap 11 5 - 15   --/--/O NEG (06/15 0811)  Imaging:  US  PELVIS (TRANSABDOMINAL ONLY) Result Date: 11/06/2023 CLINICAL DATA:  [id number] Abnormal uterine bleeding, postpartum 445320. One week postpartum. Pelvic pain. Vaginal delivery. Increasing pain. EXAM: TRANSABDOMINAL ULTRASOUND OF PELVIS TECHNIQUE: Transabdominal ultrasound examination of the pelvis was performed including evaluation of the uterus, ovaries, adnexal regions, and pelvic cul-de-sac. COMPARISON:  Ultrasound Ob 02/19/2024 FINDINGS: Uterus Measurements: 15.1 x 8.1 x 10.4 cm = volume: 67 mL. No fibroids or other mass visualized. Endometrium Thickness: 37 mm.  Thickened and vascular endometrium. Right ovary Measurements: 3.2 x 2.2 x 3.8 cm = volume: 14.3 mL. Normal appearance/no adnexal mass. Left ovary Measurements: 3.7 x 2.5 x 3.1 cm = volume: 15.1 mL. Normal appearance/no adnexal mass. Other findings:  No abnormal free fluid. IMPRESSION: Findings suggestive of retained products of conception. Electronically Signed   By: Morgane  Naveau M.D.   On: 11/06/2023 19:26     MDM & MAU COURSE  MDM:  Consulted with Dr. LOIS Moats. She recommends a pelvic exam and abdominal US . Results of  ultrasound discussed with Dr. Moats.   MAU Course:  CBC CMP Abdominal US  Pelvic Exam Cytotec 800 mcg here Report given to Camie Rote, CNM at 2010  ASSESSMENT   1. Retained products of conception, postpartum   2. Subinvolution of uterus in puerperium     PLAN  Discharge home in stable condition with bleeding precautions Prescriptions for cytotec 400mcg TID for 3 days and doxycyline 100mg  BID for 10 days sent for uterine subinvolution.     Encouraged to return here or to other Urgent Care/ED if she develops worsening of symptoms, increase in pain, fever, or other concerning symptoms.   Mitzie Molly, SNM  Camie Rote, MSN, CNM, RNC-OB Certified Nurse Midwife, [facility] 11/07/2023 3:51 AM

## 2023-11-06 NOTE — ED Triage Notes (Signed)
 Pt came in via POV d/t vaginal bleeding the past week with abd pain the last week that has gotten worse & reports passing a blood clot approx. The size of a half dollar (per pt). Is post partum x1 wk. Endorses the pain has been causing her to feel very nauseated.

## 2023-11-07 ENCOUNTER — Telehealth

## 2023-11-07 ENCOUNTER — Other Ambulatory Visit

## 2023-11-07 ENCOUNTER — Encounter: Admitting: Obstetrics & Gynecology

## 2023-11-07 ENCOUNTER — Encounter: Payer: Self-pay | Admitting: *Deleted

## 2023-11-07 VITALS — BP 122/82 | HR 83

## 2023-11-07 DIAGNOSIS — Z0130 Encounter for examination of blood pressure without abnormal findings: Secondary | ICD-10-CM

## 2023-11-07 NOTE — Progress Notes (Signed)
   NURSE VISIT- BLOOD PRESSURE CHECK  I connected with Kerry Johns Side on 11/07/2023 by MyChart video and verified that I am speaking with the correct person using two identifiers.   I discussed the limitations of evaluation and management by telemedicine. The patient expressed understanding and agreed to proceed.  Nurse is at the office, and patient is at home.  SUBJECTIVE:  Kerry Johns is a 18 y.o. [id number] female here for BP check. She is postpartum, delivery date 10/30/23.    HYPERTENSION ROS:  Pregnant/postpartum:  Severe headaches that don't go away with tylenol /other medicines: No  Visual changes (seeing spots/double/blurred vision) No  Severe pain under right breast breast or in center of upper chest No  Severe nausea/vomiting Yes  Taking medicines as instructed yes   OBJECTIVE:  BP 122/82 (BP Location: Right Arm, Patient Position: Sitting, Cuff Size: Normal)   Pulse 83   Breastfeeding Yes   Appearance alert, well appearing, and in no distress.  ASSESSMENT: Postpartum  blood pressure check  PLAN: Discussed with Dr. Ozan   Recommendations: stop medicine 2 days before next visit   Follow-up: as scheduled   Kerry Johns  11/07/2023 11:51 AM

## 2023-11-08 ENCOUNTER — Encounter: Admitting: Obstetrics & Gynecology

## 2023-11-08 ENCOUNTER — Other Ambulatory Visit

## 2023-11-09 ENCOUNTER — Telehealth: Payer: Self-pay

## 2023-11-09 NOTE — Telephone Encounter (Signed)
 Rcvd Nurse VM message from [place] from [facility] regard Pts' Rx request of Cytotec. Pt is a [facility] [employer] Pt,so called Pharmacy and gave them the number to [facility].

## 2023-11-09 NOTE — Telephone Encounter (Signed)
 Patient would like for a nurse to call her; she is very sick.

## 2023-11-09 NOTE — Telephone Encounter (Signed)
 Called patient she wanted Rn advice. Patient complains of nausea and cramping. Patient is taking Cytotec for retained parts. Rn advised as long as she wasn't bleeding or having issues she could stop the cytotec as the symptoms she is having are side effects of the medication. RN advised patient to let office know if symptoms do not clear up as she stops the cytotec.

## 2023-11-10 ENCOUNTER — Other Ambulatory Visit

## 2023-11-11 ENCOUNTER — Other Ambulatory Visit

## 2023-11-14 ENCOUNTER — Encounter: Admitting: Obstetrics & Gynecology

## 2023-11-14 ENCOUNTER — Other Ambulatory Visit

## 2023-11-14 ENCOUNTER — Encounter: Admitting: Women's Health

## 2023-11-15 ENCOUNTER — Other Ambulatory Visit

## 2023-11-15 ENCOUNTER — Encounter: Admitting: Obstetrics & Gynecology

## 2023-11-17 ENCOUNTER — Other Ambulatory Visit

## 2023-11-25 ENCOUNTER — Ambulatory Visit: Admitting: Obstetrics & Gynecology

## 2023-11-25 ENCOUNTER — Encounter: Payer: Self-pay | Admitting: Obstetrics & Gynecology

## 2023-11-25 VITALS — BP 111/74 | HR 86 | Ht 67.0 in | Wt 279.0 lb

## 2023-11-25 DIAGNOSIS — O9089 Other complications of the puerperium, not elsewhere classified: Secondary | ICD-10-CM

## 2023-11-25 NOTE — Progress Notes (Signed)
 Follow up appointment  From MAU  Chief Complaint  Patient presents with   Follow-up    Blood pressure 111/74, pulse 86, height 5' 7 (1.702 m), weight 279 lb (126.6 kg), currently breastfeeding.  Pt seen in MAU pp for bleeding cramping Felt to have uterine pp subinvolution Rx doxycycline  + cytotec  with resolution of symtpoms  No complaints today Non tender on exam  MEDS ordered this encounter: No orders of the defined types were placed in this encounter.   Orders for this encounter: No orders of the defined types were placed in this encounter.   Impression + Management Plan   ICD-10-CM   1. Subinvolution of uterus in puerperium: resolved with cytotec  + doxycycline   O90.89       Follow Up: Return for keep scheduled.     All questions were answered.  Past Medical History:  Diagnosis Date   Asthma    as a child   Headache    Hypertension     Past Surgical History:  Procedure Laterality Date   ANTERIOR CRUCIATE LIGAMENT REPAIR     arm surgery     CHOLECYSTECTOMY      OB History     Gravida  2   Para  2   Term  2   Preterm      AB      Living  2      SAB      IAB      Ectopic      Multiple  0   Live Births  2           Allergies  Allergen Reactions   Oxycodone Other (See Comments)    Stops breathing    Social History   Socioeconomic History   Marital status: Married    Spouse name: Not on file   Number of children: Not on file   Years of education: Not on file   Highest education level: Not on file  Occupational History   Not on file  Tobacco Use   Smoking status: Never   Smokeless tobacco: Never  Vaping Use   Vaping status: Never Used  Substance and Sexual Activity   Alcohol use: No   Drug use: No   Sexual activity: Not Currently    Birth control/protection: None  Other Topics Concern   Not on file  Social History Narrative   Not on file   Social Drivers of Health   Financial Resource Strain: Low Risk   (11/17/2022)   Received from [facility]   Overall Financial Resource Strain (CARDIA)    Difficulty of Paying Living Expenses: Not very hard  Food Insecurity: No Food Insecurity (10/30/2023)   Hunger Vital Sign    Worried About Running Out of Food in the Last Year: Never true    Ran Out of Food in the Last Year: Never true  Transportation Needs: No Transportation Needs (10/30/2023)   PRAPARE - Administrator, Civil Service (Medical): No    Lack of Transportation (Non-Medical): No  Physical Activity: Insufficiently Active (05/19/2022)   Exercise Vital Sign    Days of Exercise per Week: 2 days    Minutes of Exercise per Session: 30 min  Stress: Stress Concern Present (05/19/2022)   [employer] of Occupational Health - Occupational Stress Questionnaire    Feeling of Stress : To some extent  Social Connections: Moderately Integrated (10/30/2023)   Social Connection and Isolation Panel    Frequency of Communication  with Friends and Family: More than three times a week    Frequency of Social Gatherings with Friends and Family: Twice a week    Attends Religious Services: More than 4 times per year    Active Member of [employer] or Organizations: Yes    Attends Engineer, structural: More than 4 times per year    Marital Status: Never married    Family History  Problem Relation Age of Onset   Lung cancer Paternal Grandfather    Hypertension Paternal Grandmother    Cancer Maternal Grandmother    Breast cancer Maternal Grandmother    Cancer Maternal Grandfather    Hypertension Father    Diabetes Mother    Hypertension Mother

## 2023-12-07 ENCOUNTER — Ambulatory Visit: Admitting: Women's Health

## 2023-12-09 ENCOUNTER — Ambulatory Visit: Admitting: Obstetrics and Gynecology

## 2023-12-09 VITALS — BP 123/84 | HR 82 | Ht 67.0 in | Wt 281.0 lb

## 2023-12-09 DIAGNOSIS — Z3A00 Weeks of gestation of pregnancy not specified: Secondary | ICD-10-CM | POA: Diagnosis not present

## 2023-12-09 DIAGNOSIS — O10919 Unspecified pre-existing hypertension complicating pregnancy, unspecified trimester: Secondary | ICD-10-CM

## 2023-12-09 NOTE — Progress Notes (Signed)
 Post Partum Visit Note  Kerry Johns is a 18 y.o. G52P2002 female who presents for a postpartum visit. She is 5.5 weeks postpartum following a normal spontaneous vaginal delivery.  I have fully reviewed the prenatal and intrapartum course. The delivery was at 37 gestational weeks.  Anesthesia: epidural. Postpartum course has been good. Baby is doing well. Baby is feeding by breast milk (pumping) and bottle. Bleeding thin lochia. Bowel function is normal. Bladder function is normal. Patient is not sexually active. Contraception method is  . Postpartum depression screening: negative.   Upstream - 12/09/23 1118       Pregnancy Intention Screening   Does the patient want to become pregnant in the next year? No    Does the patient's partner want to become pregnant in the next year? No    Would the patient like to discuss contraceptive options today? No      Contraception Wrap Up   Current Method Abstinence    End Method No Contraception Precautions    Contraception Counseling Provided No         The pregnancy intention screening data noted above was reviewed. Potential methods of contraception were discussed. The patient elected to proceed with    Edinburgh Postnatal Depression Scale - 12/09/23 1113       Edinburgh Postnatal Depression Scale:  In the Past 7 Days   I have been able to laugh and see the funny side of things. 0    I have looked forward with enjoyment to things. 0    I have blamed myself unnecessarily when things went wrong. 2    I have been anxious or worried for no good reason. 2    I have felt scared or panicky for no good reason. 2    Things have been getting on top of me. 1    I have been so unhappy that I have had difficulty sleeping. 0    I have felt sad or miserable. 1    I have been so unhappy that I have been crying. 1    The thought of harming myself has occurred to me. 0    Edinburgh Postnatal Depression Scale Total 9          Health Maintenance  Due  Topic Date Due   HPV VACCINES (2 - 2-dose series) 06/06/2016   Meningococcal B Vaccine (1 of 2 - Standard) Never done   COVID-19 Vaccine (1 - 2024-25 season) Never done    The following portions of the patient's history were reviewed and updated as appropriate: allergies, current medications, past family history, past medical history, past social history, past surgical history, and problem list.  Review of Systems Pertinent items are noted in HPI.  Objective:  BP 123/84 (BP Location: Right Arm, Patient Position: Sitting, Cuff Size: Normal)   Pulse 82   Ht 5' 7 (1.702 m)   Wt 281 lb (127.5 kg)   Breastfeeding Yes   BMI 44.01 kg/m    General:  alert and cooperative   Breasts:  not indicated  Lungs: Normal effort     Abdomen: Soft, nondistended   Wound N/a  GU exam:  not indicated       Assessment:    1. Postpartum exam   2. Chronic hypertension during pregnancy, antepartum (Primary) Normotensive no meds, monitor at home.   3. Retained products of conception, postpartum Bleeding gradually decreasing    Plan:   Essential components of care per ACOG recommendations:  1.  Mood and well being: Patient with negative depression screening today. Reviewed local resources for support.  - Patient tobacco use? No.   - hx of drug use? No.    2. Infant care and feeding:  -Patient currently breastmilk feeding? Breast (pump) and bottle -Social determinants of health (SDOH) reviewed in EPIC. No concerns  3. Sexuality, contraception and birth spacing - Patient does not want a pregnancy in the next year.   - Reviewed reproductive life planning. Reviewed contraceptive methods based on pt preferences and effectiveness.  Patient desired nothing  today.   - Discussed birth spacing of 18 months  4. Sleep and fatigue -Encouraged family/partner/community support of 4 hrs of uninterrupted sleep to help with mood and fatigue  5. Physical Recovery  - Discussed patients delivery  and complications.  - Patient had a vaginal delivery. Patient had a hemostatic right periurethral laceration. Perineal healing reviewed. Patient expressed understanding - Patient has urinary incontinence? No. - Patient is safe to resume physical and sexual activity  6.  Health Maintenance - HM due items addressed Yes - Last pap smear No results found for: DIAGPAP Pap smear not done at today's visit.  -Breast Cancer screening indicated? No.   7. Chronic Disease/Pregnancy Condition follow up: Hypertension  - PCP follow up  Nidia Daring, FNP [facility]

## 2024-01-28 ENCOUNTER — Other Ambulatory Visit: Payer: Self-pay

## 2024-01-28 ENCOUNTER — Emergency Department
Admission: EM | Admit: 2024-01-28 | Discharge: 2024-01-28 | Disposition: A | Discharge status: Home / Self Care | Attending: Emergency Medicine | Admitting: Emergency Medicine

## 2024-01-28 DIAGNOSIS — N3000 Acute cystitis without hematuria: Secondary | ICD-10-CM | POA: Insufficient documentation

## 2024-01-28 DIAGNOSIS — R197 Diarrhea, unspecified: Secondary | ICD-10-CM | POA: Diagnosis present

## 2024-01-28 DIAGNOSIS — K5900 Constipation, unspecified: Secondary | ICD-10-CM | POA: Insufficient documentation

## 2024-01-28 DIAGNOSIS — I10 Essential (primary) hypertension: Secondary | ICD-10-CM | POA: Insufficient documentation

## 2024-01-28 LAB — URINALYSIS, ROUTINE W REFLEX MICROSCOPIC
Bilirubin Urine: NEGATIVE
Glucose, UA: NEGATIVE mg/dL
Ketones, ur: NEGATIVE mg/dL
Nitrite: POSITIVE — AB
Protein, ur: 100 mg/dL — AB
Specific Gravity, Urine: 1.023 (ref 1.005–1.030)
WBC, UA: >50 WBC/hpf (ref 0–5)
pH: 5 (ref 5.0–8.0)

## 2024-01-28 LAB — CBC WITH DIFFERENTIAL/PLATELET
Abs Immature Granulocytes: 0.02 K/uL (ref 0.00–0.07)
Basophils Absolute: 0 K/uL (ref 0.0–0.1)
Basophils Relative: 0 %
Eosinophils Absolute: 0.1 K/uL (ref 0.0–0.5)
Eosinophils Relative: 1 %
HCT: 35.2 % — ABNORMAL LOW (ref 36.0–46.0)
Hemoglobin: 11.4 g/dL — ABNORMAL LOW (ref 12.0–15.0)
Immature Granulocytes: 0 %
Lymphocytes Relative: 28 %
Lymphs Abs: 2 K/uL (ref 0.7–4.0)
MCH: 28.1 pg (ref 26.0–34.0)
MCHC: 32.4 g/dL (ref 30.0–36.0)
MCV: 86.9 fL (ref 80.0–100.0)
Monocytes Absolute: 0.5 K/uL (ref 0.1–1.0)
Monocytes Relative: 7 %
Neutro Abs: 4.4 K/uL (ref 1.7–7.7)
Neutrophils Relative %: 64 %
Platelets: 233 K/uL (ref 150–400)
RBC: 4.05 MIL/uL (ref 3.87–5.11)
RDW: 13.4 % (ref 11.5–15.5)
WBC: 7 K/uL (ref 4.0–10.5)
nRBC: 0 % (ref 0.0–0.2)

## 2024-01-28 LAB — BASIC METABOLIC PANEL WITH GFR
Anion gap: 11 (ref 5–15)
BUN: 13 mg/dL (ref 6–20)
CO2: 24 mmol/L (ref 22–32)
Calcium: 8.9 mg/dL (ref 8.9–10.3)
Chloride: 103 mmol/L (ref 98–111)
Creatinine, Ser: 0.94 mg/dL (ref 0.44–1.00)
GFR, Estimated: >60 mL/min (ref >60)
Glucose, Bld: 92 mg/dL (ref 70–99)
Potassium: 3.5 mmol/L (ref 3.5–5.1)
Sodium: 138 mmol/L (ref 135–145)

## 2024-01-28 MED ORDER — CEFUROXIME AXETIL 250 MG PO TABS
250.0000 mg | ORAL_TABLET | Freq: Once | ORAL | Status: AC
  Administered 2024-01-28: 250 mg via ORAL
  Filled 2024-01-28: qty 1

## 2024-01-28 MED ORDER — CEFUROXIME AXETIL 250 MG PO TABS: 250.0000 mg | ORAL_TABLET | Freq: Two times a day (BID) | ORAL | 0 refills | Status: AC

## 2024-01-28 MED ORDER — MORPHINE SULFATE (PF) 4 MG/ML IV SOLN
4.0000 mg | Freq: Once | INTRAVENOUS | Status: AC
  Administered 2024-01-28: 4 mg via INTRAVENOUS
  Filled 2024-01-28: qty 1

## 2024-01-28 MED ORDER — LACTATED RINGERS IV BOLUS
1000.0000 mL | Freq: Once | INTRAVENOUS | Status: AC
  Administered 2024-01-28: 1000 mL via INTRAVENOUS

## 2024-01-28 MED ORDER — POLYETHYLENE GLYCOL 3350 17 G PO PACK
34.0000 g | PACK | Freq: Once | ORAL | Status: AC
  Administered 2024-01-28: 34 g via ORAL
  Filled 2024-01-28: qty 2

## 2024-01-28 NOTE — ED Notes (Signed)
 Pt given DC instructions. Pt verbalized understanding of medications and follow up care. Pt ambulatory from ED without difficulty.

## 2024-01-28 NOTE — Discharge Instructions (Signed)
 Ceftin  antibiotics twice daily for 5 days  MiraLAX  1-3 capfuls per dose, 1-2 times per day.  Once you start passing stool more regularly then cut back to 1-2 capfuls per day.

## 2024-01-28 NOTE — ED Triage Notes (Signed)
 Pt having difficulty w/ BM's x2 days and pt states pain w/ sitting and dysuria. Pt reports last normal BM a week ago, last partial BM yesterday. Pt leaking diarrhea due to taking stool softeners and has also tried using lidocaine  around the area w/ out relief.

## 2024-01-28 NOTE — ED Provider Notes (Signed)
 [facility] Provider Note    Event Date/Time   First MD Initiated Contact with Patient 01/28/24 1645     (approximate)   History   No chief complaint on file.   HPI  Kerry Johns is a 18 y.o. female who presents to the ED for evaluation of No chief complaint on file.   I review OB/GYN clinic visit from July.  Morbidly obese patient, G2, P2, who delivered vaginally 6/15 at 37 weeks.  Chronic hypertension  Patient presents to the ED alongside her husband and 2 children for evaluation of constipation and associated difficulty voiding.  Reports constipation progressive over the past 1 week.  Reports feeling a hard ball of stool in her rectum, taking stool softeners and laxatives and having some diarrhea around this but unable to pass sufficient stool.   Reports her last normal urine voiding was yesterday.  Today she is only passed urine involuntarily in the setting of straining to try to pass stool.  Nausea without emesis.   Physical Exam   Triage Vital Signs: ED Triage Vitals  Encounter Vitals Group     BP 01/28/24 1637 (!) 140/83     Girls Systolic BP Percentile --      Girls Diastolic BP Percentile --      Boys Systolic BP Percentile --      Boys Diastolic BP Percentile --      Pulse Rate 01/28/24 1637 92     Resp 01/28/24 1637 18     Temp 01/28/24 1637 98.1 F (36.7 C)     Temp Source 01/28/24 1637 Oral     SpO2 01/28/24 1637 97 %     Weight 01/28/24 1638 270 lb (122.5 kg)     Height 01/28/24 1638 5' 7 (1.702 m)     Head Circumference --      Peak Flow --      Pain Score 01/28/24 1638 5     Pain Loc --      Pain Education --      Exclude from Growth Chart --     Most recent vital signs: Vitals:   01/28/24 1637  BP: (!) 140/83  Pulse: 92  Resp: 18  Temp: 98.1 F (36.7 C)  SpO2: 97%    General: Awake, no distress.  Morbidly obese, lying her side and generally well-appearing CV:  Good peripheral perfusion.  Resp:  Normal  effort.  Abd:  No distention.  Minimal lower abdominal tenderness to palpation, benign upper abdomen.  No guarding or peritoneal features. MSK:  No deformity noted.  Neuro:  No focal deficits appreciated. Other:  Chaperoned rectal exam without evidence of any external hemorrhoids or significant anal fissures.  Impacted stool ball   ED Results / Procedures / Treatments   Labs (all labs ordered are listed, but only abnormal results are displayed) Labs Reviewed  CBC WITH DIFFERENTIAL/PLATELET - Abnormal; Notable for the following components:      Result Value   Hemoglobin 11.4 (*)    HCT 35.2 (*)    All other components within normal limits  URINALYSIS, ROUTINE W REFLEX MICROSCOPIC - Abnormal; Notable for the following components:   Color, Urine YELLOW (*)    APPearance TURBID (*)    Hgb urine dipstick SMALL (*)    Protein, ur 100 (*)    Nitrite POSITIVE (*)    Leukocytes,Ua MODERATE (*)    Bacteria, UA MANY (*)    All other components within normal limits  URINE CULTURE  BASIC METABOLIC PANEL WITH GFR    EKG   RADIOLOGY   Official radiology report(s): No results found.  PROCEDURES and INTERVENTIONS:  .Fecal disimpaction  Date/Time: 01/28/2024 7:39 PM  Performed by: Claudene Rover, MD Authorized by: Claudene Rover, MD  Consent: Verbal consent obtained Risks and benefits: risks, benefits and alternatives were discussed Time out: Immediately prior to procedure a time out was called to verify the correct patient, procedure, equipment, support staff and site/side marked as required. Local anesthesia used: no  Anesthesia: Local anesthesia used: no  Sedation: Patient sedated: no  Patient tolerance: patient tolerated the procedure well with no immediate complications     Medications  lactated ringers  bolus 1,000 mL (0 mLs Intravenous Stopped 01/28/24 1908)  morphine  (PF) 4 MG/ML injection 4 mg (4 mg Intravenous Given 01/28/24 1735)  polyethylene glycol (MIRALAX  /  GLYCOLAX ) packet 34 g (34 g Oral Given 01/28/24 1821)  cefUROXime  (CEFTIN ) tablet 250 mg (250 mg Oral Given 01/28/24 1907)     IMPRESSION / MDM / ASSESSMENT AND PLAN / ED COURSE  I reviewed the triage vital signs and the nursing notes.  Differential diagnosis includes, but is not limited to, constipation, SBO, external hemorrhoid, anal fissure, thrombosed hemorrhoid, UTI, sepsis  {Patient presents with symptoms of an acute illness or injury that is potentially life-threatening.  Patient presents with evidence of constipation with fecal impaction requiring manual disimpaction, with signs of acute cystitis, ultimately suitable for outpatient management with antibiotics.  Reassuring blood work with normal CBC and metabolic panel.  Manual disimpaction and MiraLAX , then patient passed a significant bowel movement and feels much better.  Subsequent urine sample with signs of infection.  Sent for culture and started on Ceftin  for 5 days.  Clinical Course as of 01/28/24 1939  Sat Jan 28, 2024  1807 Reassessed with nursing staff and performed fecal disimpaction [DS]  1938 Reassessed, patient sitting upright on bedside chair and looks much better.  She is appreciative.  We discussed signs of UTI, antibiotics, managing constipation at home and ED return precautions. [DS]    Clinical Course User Index [DS] Claudene Rover, MD     FINAL CLINICAL IMPRESSION(S) / ED DIAGNOSES   Final diagnoses:  Constipation, unspecified constipation type  Acute cystitis without hematuria     Rx / DC Orders   ED Discharge Orders          Ordered    cefUROXime  (CEFTIN ) 250 MG tablet  2 times daily with meals        01/28/24 1936             Note:  This document was prepared using Dragon voice recognition software and may include unintentional dictation errors.   Claudene Rover, MD 01/28/24 [phone]

## 2024-01-30 LAB — URINE CULTURE: Culture: >=100000 — AB
